# Patient Record
Sex: Female | Born: 1960
Health system: Southern US, Community
[De-identification: ages and names within clinical notes are randomized; demographics above are authoritative.]

## PROBLEM LIST (undated history)

## (undated) DIAGNOSIS — E785 Hyperlipidemia, unspecified: Secondary | ICD-10-CM

## (undated) HISTORY — PX: WISDOM TOOTH EXTRACTION: SHX21

## (undated) HISTORY — PX: COLONOSCOPY: SHX174

## (undated) HISTORY — PX: CYST EXCISION: SHX5701

## (undated) HISTORY — DX: Hyperlipidemia, unspecified: E78.5

---

## 2010-07-29 LAB — HM COLONOSCOPY

## 2015-02-21 LAB — BASIC METABOLIC PANEL
BUN: 15 (ref 4–21)
CREATININE: 0.8 (ref 0.5–1.1)
Glucose: 82
Potassium: 4.6 (ref 3.4–5.3)
Sodium: 141 (ref 137–147)

## 2015-02-21 LAB — CBC AND DIFFERENTIAL
HCT: 39 (ref 36–46)
Hemoglobin: 12.5 (ref 12.0–16.0)
PLATELETS: 270 (ref 150–399)
WBC: 5.2

## 2015-02-21 LAB — LIPID PANEL
CHOLESTEROL: 218 — AB (ref 0–200)
HDL: 72 — AB (ref 35–70)
LDL CALC: 135
Triglycerides: 56 (ref 40–160)

## 2015-02-21 LAB — HEPATIC FUNCTION PANEL
ALK PHOS: 86 (ref 25–125)
ALT: 18 (ref 7–35)
AST: 28 (ref 13–35)
Bilirubin, Total: 0.4

## 2015-02-21 LAB — TSH: TSH: 5.41 (ref 0.41–5.90)

## 2016-10-22 DIAGNOSIS — Z23 Encounter for immunization: Secondary | ICD-10-CM | POA: Diagnosis not present

## 2017-02-05 DIAGNOSIS — M9901 Segmental and somatic dysfunction of cervical region: Secondary | ICD-10-CM | POA: Diagnosis not present

## 2017-02-05 DIAGNOSIS — M62830 Muscle spasm of back: Secondary | ICD-10-CM | POA: Diagnosis not present

## 2017-02-05 DIAGNOSIS — M9902 Segmental and somatic dysfunction of thoracic region: Secondary | ICD-10-CM | POA: Diagnosis not present

## 2017-02-05 DIAGNOSIS — M9903 Segmental and somatic dysfunction of lumbar region: Secondary | ICD-10-CM | POA: Diagnosis not present

## 2017-02-19 DIAGNOSIS — M62830 Muscle spasm of back: Secondary | ICD-10-CM | POA: Diagnosis not present

## 2017-02-19 DIAGNOSIS — M9901 Segmental and somatic dysfunction of cervical region: Secondary | ICD-10-CM | POA: Diagnosis not present

## 2017-02-19 DIAGNOSIS — M9902 Segmental and somatic dysfunction of thoracic region: Secondary | ICD-10-CM | POA: Diagnosis not present

## 2017-02-19 DIAGNOSIS — M9903 Segmental and somatic dysfunction of lumbar region: Secondary | ICD-10-CM | POA: Diagnosis not present

## 2017-02-23 DIAGNOSIS — M9901 Segmental and somatic dysfunction of cervical region: Secondary | ICD-10-CM | POA: Diagnosis not present

## 2017-02-23 DIAGNOSIS — M62830 Muscle spasm of back: Secondary | ICD-10-CM | POA: Diagnosis not present

## 2017-02-23 DIAGNOSIS — M9902 Segmental and somatic dysfunction of thoracic region: Secondary | ICD-10-CM | POA: Diagnosis not present

## 2017-02-23 DIAGNOSIS — M9903 Segmental and somatic dysfunction of lumbar region: Secondary | ICD-10-CM | POA: Diagnosis not present

## 2017-02-25 DIAGNOSIS — M9902 Segmental and somatic dysfunction of thoracic region: Secondary | ICD-10-CM | POA: Diagnosis not present

## 2017-02-25 DIAGNOSIS — M62830 Muscle spasm of back: Secondary | ICD-10-CM | POA: Diagnosis not present

## 2017-02-25 DIAGNOSIS — M9901 Segmental and somatic dysfunction of cervical region: Secondary | ICD-10-CM | POA: Diagnosis not present

## 2017-02-25 DIAGNOSIS — M9903 Segmental and somatic dysfunction of lumbar region: Secondary | ICD-10-CM | POA: Diagnosis not present

## 2017-02-26 DIAGNOSIS — M9901 Segmental and somatic dysfunction of cervical region: Secondary | ICD-10-CM | POA: Diagnosis not present

## 2017-02-26 DIAGNOSIS — M9902 Segmental and somatic dysfunction of thoracic region: Secondary | ICD-10-CM | POA: Diagnosis not present

## 2017-02-26 DIAGNOSIS — M62830 Muscle spasm of back: Secondary | ICD-10-CM | POA: Diagnosis not present

## 2017-02-26 DIAGNOSIS — M9903 Segmental and somatic dysfunction of lumbar region: Secondary | ICD-10-CM | POA: Diagnosis not present

## 2017-03-03 DIAGNOSIS — M9902 Segmental and somatic dysfunction of thoracic region: Secondary | ICD-10-CM | POA: Diagnosis not present

## 2017-03-03 DIAGNOSIS — M9903 Segmental and somatic dysfunction of lumbar region: Secondary | ICD-10-CM | POA: Diagnosis not present

## 2017-03-03 DIAGNOSIS — M9901 Segmental and somatic dysfunction of cervical region: Secondary | ICD-10-CM | POA: Diagnosis not present

## 2017-03-03 DIAGNOSIS — M62830 Muscle spasm of back: Secondary | ICD-10-CM | POA: Diagnosis not present

## 2017-03-04 DIAGNOSIS — M9903 Segmental and somatic dysfunction of lumbar region: Secondary | ICD-10-CM | POA: Diagnosis not present

## 2017-03-04 DIAGNOSIS — M9902 Segmental and somatic dysfunction of thoracic region: Secondary | ICD-10-CM | POA: Diagnosis not present

## 2017-03-04 DIAGNOSIS — M62830 Muscle spasm of back: Secondary | ICD-10-CM | POA: Diagnosis not present

## 2017-03-04 DIAGNOSIS — M9901 Segmental and somatic dysfunction of cervical region: Secondary | ICD-10-CM | POA: Diagnosis not present

## 2017-03-06 DIAGNOSIS — M9902 Segmental and somatic dysfunction of thoracic region: Secondary | ICD-10-CM | POA: Diagnosis not present

## 2017-03-06 DIAGNOSIS — M9903 Segmental and somatic dysfunction of lumbar region: Secondary | ICD-10-CM | POA: Diagnosis not present

## 2017-03-06 DIAGNOSIS — M9901 Segmental and somatic dysfunction of cervical region: Secondary | ICD-10-CM | POA: Diagnosis not present

## 2017-03-06 DIAGNOSIS — M62830 Muscle spasm of back: Secondary | ICD-10-CM | POA: Diagnosis not present

## 2017-03-09 ENCOUNTER — Encounter (INDEPENDENT_AMBULATORY_CARE_PROVIDER_SITE_OTHER): Payer: Self-pay

## 2017-03-09 ENCOUNTER — Ambulatory Visit (INDEPENDENT_AMBULATORY_CARE_PROVIDER_SITE_OTHER): Payer: BLUE CROSS/BLUE SHIELD | Admitting: Orthopaedic Surgery

## 2017-03-09 ENCOUNTER — Encounter (INDEPENDENT_AMBULATORY_CARE_PROVIDER_SITE_OTHER): Payer: Self-pay | Admitting: Orthopaedic Surgery

## 2017-03-09 VITALS — BP 116/72 | HR 69 | Resp 14 | Ht 70.0 in | Wt 155.0 lb

## 2017-03-09 DIAGNOSIS — M7711 Lateral epicondylitis, right elbow: Secondary | ICD-10-CM

## 2017-03-09 MED ORDER — DICLOFENAC SODIUM 1 % TD GEL: 2.0000 g | Freq: Four times a day (QID) | TRANSDERMAL | 1 refills | Status: DC

## 2017-03-09 NOTE — Patient Instructions (Signed)
Tennis Elbow Rehab Ask your health care provider which exercises are safe for you. Do exercises exactly as told by your health care provider and adjust them as directed. It is normal to feel mild stretching, pulling, tightness, or discomfort as you do these exercises, but you should stop right away if you feel sudden pain or your pain gets worse. Do not begin these exercises until told by your health care provider. Stretching and range of motion exercises These exercises warm up your muscles and joints and improve the movement and flexibility of your elbow. These exercises also help to relieve pain, numbness, and tingling. Exercise A: Wrist extensor stretch Extend your left / right elbow with your fingers pointing down. Gently pull the palm of your left / right hand toward you until you feel a gentle stretch on the top of your forearm. To increase the stretch, push your left / right hand toward the outer edge or pinkie side of your forearm. Hold this position for __________ seconds. Repeat __________ times. Complete this exercise __________ times a day. If directed by your health care provider, repeat this stretch except do it with a bent elbow this time. Exercise B: Wrist flexor stretch  Extend your left / right elbow and turn your palm upward. Gently pull your left / right palm and fingertips back so your wrist extends and your fingers point more toward the ground. You should feel a gentle stretch on the inside of your forearm. Hold this position for __________ seconds. Repeat __________ times. Complete this exercise __________ times a day. If directed by your health care provider, repeat this stretch except do it with a bent elbow this time. Strengthening exercises These exercises build strength and endurance in your elbow. Endurance is the ability to use your muscles for a long time, even after they get tired. Exercise C: Wrist extensors  Sit with your left / right forearm palm-down and  fully supported on a table or countertop. Your elbow should be resting below the height of your shoulder. Let your left / right wrist extend over the edge of the surface. Loosely hold a __________ weight or a piece of rubber exercise band or tubing in your left / right hand. Slowly curl your left / right hand up toward your forearm. If you are using band or tubing, hold the band or tubing in place with your other hand to provide resistance. Hold this position for __________ seconds. Slowly return to the starting position. Repeat __________ times. Complete this exercise __________ times a day. Exercise D: Radial deviators  Stand with a __________ weight in your left / righthand. Or, sit while holding a rubber exercise band or tubing with your other arm supported on a table or countertop. Position your hand so your thumb is on top. Raise your hand upward in front of you so your thumb travels toward your forearm, or pull up on the rubber tubing. Hold this position for __________ seconds. Slowly return to the starting position. Repeat __________ times. Complete this exercise __________ times a day. Exercise E: Eccentric wrist extensors Sit with your left / right forearm palm-down and fully supported on a table or countertop. Your elbow should be resting below the height of your shoulder. If told by your health care provider, hold a __________ weight in your hand. Let your left / right wrist extend over the edge of the surface. Use your other hand to lift up your left / right hand toward your forearm. Keep your forearm  on the table. Using only the muscles in your left / right hand, slowly lower your hand back down to the starting position. Repeat __________ times. Complete this exercise __________ times a day. This information is not intended to replace advice given to you by your health care provider. Make sure you discuss any questions you have with your health care provider. Document Released:  02/03/2005 Document Revised: 10/10/2015 Document Reviewed: 11/02/2014 Elsevier Interactive Patient Education  2018 [employer] Elbow Tennis elbow is puffiness (inflammation) of the outer tendons of your forearm close to your elbow. Your tendons attach your muscles to your bones. Tennis elbow can happen in any sport or job in which you use your elbow too much. It is caused by doing the same motion over and over. Tennis elbow can cause:  Pain and tenderness in your forearm and the outer part of your elbow.  A burning feeling. This runs from your elbow through your arm.  Weak grip in your hands.  Follow these instructions at home: Activity  Rest your elbow and wrist as told by your doctor. Try to avoid any activities that caused the problem until your doctor says that you can do them again.  If a physical therapist teaches you exercises, do all of them as told.  If you lift an object, lift it with your palm facing up. This is easier on your elbow. Lifestyle  If your tennis elbow is caused by sports, check your equipment and make sure that: ? You are using it correctly. ? It fits you well.  If your tennis elbow is caused by work, take breaks often, if you are able. Talk with your manager about doing your work in a way that is safe for you. ? If your tennis elbow is caused by computer use, talk with your manager about any changes that can be made to your work setup. General instructions  If told, apply ice to the painful area: ? Put ice in a plastic bag. ? Place a towel between your skin and the bag. ? Leave the ice on for 20 minutes, 2-3 times per day.  Take medicines only as told by your doctor.  If you were given a brace, wear it as told by your doctor.  Keep all follow-up visits as told by your doctor. This is important. Contact a doctor if:  Your pain does not get better with treatment.  Your pain gets worse.  You have weakness in your forearm, hand, or  fingers.  You cannot feel your forearm, hand, or fingers. This information is not intended to replace advice given to you by your health care provider. Make sure you discuss any questions you have with your health care provider. Document Released: 07/24/2009 Document Revised: 10/04/2015 Document Reviewed: 01/30/2014 Elsevier Interactive Patient Education  [employer].

## 2017-03-09 NOTE — Progress Notes (Signed)
Office Visit Note   Patient: Christine Medina           Date of Birth: 03-Jul-1960           MRN: [id number] Visit Date: 03/09/2017              Requested by: No referring provider defined for this encounter. PCP: Patient, No Pcp Per   Assessment & Plan: Visit Diagnoses:  1. Lateral epicondylitis of right elbow     Plan: Right elbow pain is consistent with tennis elbow. We'll plan tennis elbow strap, physical therapy and Voltaren gel. Have discussed other options including cortisone injection in future. Also has many other areas of tenderness including right foot with pain consistent with peroneal tendinitis at its insertion on the base the fifth metatarsal. Has arthritis at the base of both thumbs appears to be mild.. Also experiencing some discomfort in both the right arm and right leg which has been helped with Pilates and chiropractic treatment. I would suggest she continue with that as she's not localizing symptoms  Follow-Up Instructions: Return if symptoms worsen or fail to improve.   Orders:  No orders of the defined types were placed in this encounter.  No orders of the defined types were placed in this encounter.     Procedures: No procedures performed   Clinical Data: No additional findings.   Subjective: Chief Complaint  Patient presents with  . Right Elbow - Pain    Mrs. Tunks is a 57 y o here today for Right elbow pain including all the upper extremity to neck. Pt moved here 4 mo ago, played tennis, golf and paddle tennis with son and is annoying now.   Mrs. Guilliams relates onset of right lateral elbow pain after playing tennis with her son on several occasions this past summer. The pain seems to recur when she's been active whether it's playing tennis or golf or the pain is localized along the lateral aspect of her elbow without numbness tingling or change in appearance of the elbow.. Also is been arrested experiencing some pain in the base of both thumbs  and the lateral aspect of her right foot. Also describes some tingling and numbness in right upper and right lower extremity on occasion that more recently has responded to chiropractic treatment and Pilates exercises  HPI  Review of Systems   Objective: Vital Signs: BP 116/72   Pulse 69   Resp 14   Ht 5\' 10"  (1.778 m)   Wt 155 lb (70.3 kg)   BMI 22.24 kg/m   Physical Exam  Ortho Exam awake alert and oriented 3. Comfortable sitting. Painless range of motion cervical spine without referred pain to either upper extremity. No localized tenderness. No pain with shoulder range of motion.  Right elbow tenderness directly over the lateral lip condyle. Has pain with grip and flexion mildly and more pain with grip and extension. No pain over the radial head. No pain along the olecranon. Neurovascular exam is intact. Some mild pain at the base of both thumbs with minimally positive grind test. Straight leg raise negative bilaterally. Painless range of motion both hips and both knees. Vascular exam intact. Reflexes intact. Motor exam intact. No percussible tenderness to cervical or lumbar spine. Discomfort at the very base of the right fifth metatarsal peroneal tendon function intact. No skin change  Specialty Comments:  No specialty comments available.  Imaging: No results found.   PMFS History: Patient Active Problem List   Diagnosis Date  Noted  . Lateral epicondylitis of right elbow 03/09/2017   History reviewed. No pertinent past medical history.  Family History  Problem Relation Age of Onset  . Cancer Mother   . Heart disease Father     History reviewed. No pertinent surgical history. Social History   Occupational History  . Not on file  Tobacco Use  . Smoking status: Never Smoker  . Smokeless tobacco: Never Used  Substance and Sexual Activity  . Alcohol use: Yes    Alcohol/week: 1.8 oz    Types: 3 Standard drinks or equivalent per week  . Drug use: No  . Sexual  activity: Not on file     Garald Balding, MD   Note - This record has been created using [employer].  Chart creation errors have been sought, but may not always  have been located. Such creation errors do not reflect on  the standard of medical care.

## 2017-03-10 ENCOUNTER — Telehealth (INDEPENDENT_AMBULATORY_CARE_PROVIDER_SITE_OTHER): Payer: Self-pay | Admitting: Orthopaedic Surgery

## 2017-03-10 DIAGNOSIS — M9903 Segmental and somatic dysfunction of lumbar region: Secondary | ICD-10-CM | POA: Diagnosis not present

## 2017-03-10 DIAGNOSIS — M9901 Segmental and somatic dysfunction of cervical region: Secondary | ICD-10-CM | POA: Diagnosis not present

## 2017-03-10 DIAGNOSIS — M9902 Segmental and somatic dysfunction of thoracic region: Secondary | ICD-10-CM | POA: Diagnosis not present

## 2017-03-10 DIAGNOSIS — M62830 Muscle spasm of back: Secondary | ICD-10-CM | POA: Diagnosis not present

## 2017-03-10 NOTE — Telephone Encounter (Signed)
Patient called stating that [facility] was unable to fill her prescription for the Voltaren Gel.  She was told that additional information was needed before the prescription could be filled.  Patient's CB# [phone]

## 2017-03-10 NOTE — Telephone Encounter (Signed)
Sent add'l info

## 2017-03-11 DIAGNOSIS — M62830 Muscle spasm of back: Secondary | ICD-10-CM | POA: Diagnosis not present

## 2017-03-11 DIAGNOSIS — M9902 Segmental and somatic dysfunction of thoracic region: Secondary | ICD-10-CM | POA: Diagnosis not present

## 2017-03-11 DIAGNOSIS — M9903 Segmental and somatic dysfunction of lumbar region: Secondary | ICD-10-CM | POA: Diagnosis not present

## 2017-03-11 DIAGNOSIS — M9901 Segmental and somatic dysfunction of cervical region: Secondary | ICD-10-CM | POA: Diagnosis not present

## 2017-03-13 ENCOUNTER — Telehealth (INDEPENDENT_AMBULATORY_CARE_PROVIDER_SITE_OTHER): Payer: Self-pay | Admitting: Orthopaedic Surgery

## 2017-03-13 DIAGNOSIS — M9901 Segmental and somatic dysfunction of cervical region: Secondary | ICD-10-CM | POA: Diagnosis not present

## 2017-03-13 DIAGNOSIS — M62830 Muscle spasm of back: Secondary | ICD-10-CM | POA: Diagnosis not present

## 2017-03-13 DIAGNOSIS — M9903 Segmental and somatic dysfunction of lumbar region: Secondary | ICD-10-CM | POA: Diagnosis not present

## 2017-03-13 DIAGNOSIS — M9902 Segmental and somatic dysfunction of thoracic region: Secondary | ICD-10-CM | POA: Diagnosis not present

## 2017-03-13 NOTE — Telephone Encounter (Signed)
Patient called stating that she still has not heard from the physical therapist to schedule an appointment.  She was unclear whether our office was making the referral appointment or if she was suppose to call them.  Patient's CB# [phone]

## 2017-03-16 ENCOUNTER — Other Ambulatory Visit (INDEPENDENT_AMBULATORY_CARE_PROVIDER_SITE_OTHER): Payer: Self-pay

## 2017-03-16 DIAGNOSIS — M25521 Pain in right elbow: Secondary | ICD-10-CM

## 2017-03-16 DIAGNOSIS — M9901 Segmental and somatic dysfunction of cervical region: Secondary | ICD-10-CM | POA: Diagnosis not present

## 2017-03-16 DIAGNOSIS — M62830 Muscle spasm of back: Secondary | ICD-10-CM | POA: Diagnosis not present

## 2017-03-16 DIAGNOSIS — M9902 Segmental and somatic dysfunction of thoracic region: Secondary | ICD-10-CM | POA: Diagnosis not present

## 2017-03-16 DIAGNOSIS — M9903 Segmental and somatic dysfunction of lumbar region: Secondary | ICD-10-CM | POA: Diagnosis not present

## 2017-03-16 NOTE — Telephone Encounter (Signed)
Called and sent referral, will check later to make sure they have order and schedule pt.

## 2017-03-17 DIAGNOSIS — M9903 Segmental and somatic dysfunction of lumbar region: Secondary | ICD-10-CM | POA: Diagnosis not present

## 2017-03-17 DIAGNOSIS — M9901 Segmental and somatic dysfunction of cervical region: Secondary | ICD-10-CM | POA: Diagnosis not present

## 2017-03-17 DIAGNOSIS — M62830 Muscle spasm of back: Secondary | ICD-10-CM | POA: Diagnosis not present

## 2017-03-17 DIAGNOSIS — M9902 Segmental and somatic dysfunction of thoracic region: Secondary | ICD-10-CM | POA: Diagnosis not present

## 2017-03-25 ENCOUNTER — Encounter: Payer: Self-pay | Admitting: Physical Therapy

## 2017-03-25 ENCOUNTER — Ambulatory Visit: Payer: BLUE CROSS/BLUE SHIELD | Attending: Orthopaedic Surgery | Admitting: Physical Therapy

## 2017-03-25 ENCOUNTER — Other Ambulatory Visit: Payer: Self-pay

## 2017-03-25 DIAGNOSIS — M25521 Pain in right elbow: Secondary | ICD-10-CM

## 2017-03-26 NOTE — Therapy (Addendum)
[place], [place], [address] Phone: [phone]   Fax:  [phone]  Physical Therapy Evaluation  Patient Details  Name: Porter Nakama MRN: [id number] Date of Birth: August 15, 1960 Referring Provider: Dr Joni Fears    Encounter Date: 03/25/2017  PT End of Session - 03/26/17 1209    Visit Number  1    Number of Visits  16    Date for PT Re-Evaluation  05/21/17    Authorization Type  [employer]     PT Start Time  1500    PT Stop Time  1544    PT Time Calculation (min)  44 min    Activity Tolerance  Patient tolerated treatment well    Behavior During Therapy  [facility] for tasks assessed/performed       History reviewed. No pertinent past medical history.  History reviewed. No pertinent surgical history.  There were no vitals filed for this visit.   Subjective Assessment - 03/25/17 1508    Subjective  Patient reports Pain in her elbow that radiates at times into her hand. The pain at times starts in her neck and shoots down her arm. She is currently being treated for her neck as well. The pain started playing tennis a year ago. It is worse now when she plays golf. She is having no pain today.     Diagnostic tests  Nothing taken     Patient Stated Goals  to have less pain in her elbow     Currently in Pain?  Yes    Pain Score  7     Pain Location  Elbow    Pain Orientation  Right    Pain Descriptors / Indicators  Aching    Pain Type  Acute pain    Pain Onset  More than a month ago    Pain Frequency  Constant         [facility] PT Assessment - 03/26/17 0001      Assessment   Medical Diagnosis  Right elbow pain     Referring Provider  Dr Joni Fears     Onset Date/Surgical Date  -- August 2018     Hand Dominance  Right    Next MD Visit  None Scheduled     Prior Therapy  Going to Chiropracter for her neck       Precautions   Precautions  None      Restrictions   Weight Bearing Restrictions  No      Balance Screen   Has the patient fallen in the past 6 months  No    Has the patient had a decrease in activity level because of a fear of falling?   No    Is the patient reluctant to leave their home because of a fear of falling?   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     Leisure  Golf      Cognition   Overall Cognitive Status  Within Functional Limits for tasks assessed    Attention  Focused    Focused Attention  Appears intact    Memory  Appears intact    Awareness  Appears intact    Problem Solving  Appears intact      Sensation  Light Touch  Appears Intact    Additional Comments  tingling into the leg through the foot       AROM   Overall AROM Comments  full active ROM of wrist and elbow      PROM   Overall PROM Comments  Full passive ROM of the wrist       Strength   Overall Strength Comments  5/5 gross shoulder strength     Right Hand Grip (lbs)  65    Left Hand Grip (lbs)  60      Palpation   Palpation comment  tenderness to palpation in the lateral epicondyle; spasming of the right upper trap              Objective measurements completed on examination: See above findings.              PT Education - 03/26/17 1208    Education provided  Yes    Education Details  HEP; improtance of improving strength and mobility     Person(s) Educated  Patient    Methods  Explanation;Demonstration;Tactile cues;Verbal cues    Comprehension  Verbalized understanding;Returned demonstration;Verbal cues required;Tactile cues required          PT Long Term Goals - 03/25/17 1600      PT LONG TERM GOAL #1   Title  Patient will retunr to golf without pain     Time  6    Period  Weeks    Status  New    Target Date  05/06/17      PT LONG TERM GOAL #2   Title  Patient will carry object without  pain     Time  6    Period  Weeks    Status  New    Target Date  05/06/17      PT LONG TERM GOAL #3   Title  Patient will demsotrte full wrist and elbow motion without pain in order to perform daily tasks     Time  6    Period  Weeks    Status  New    Target Date  05/06/17             Plan - 03/26/17 1210    Clinical Impression Statement  Patient is a 57 year old female with elbow pain. Her pain raidates down into her arm. She also has pain that radiates down her neck to her hand. Today she had minial deficits. She is also haveing no pain today. She does have tenderness to palpation in the lateral epicondyle. She also has tightness in her right upper trap. She would benefit from further skilled therapy. She would like to try the exercises and sttretches on her own. she may return for a follow up as needed. she was also given stretches for her upper trap spasming.     History and Personal Factors relevant to plan of care:  none     Clinical Presentation  Stable    Clinical Decision Making  Low    PT Frequency  1x / week    PT Duration  4 weeks    PT Treatment/Interventions  ADLs/Self Care Home Management;Cryotherapy;Electrical Stimulation;Iontophoresis 67m/ml Dexamethasone;Ultrasound;Therapeutic activities;Therapeutic exercise;Patient/family education;Manual techniques;Passive range of motion;Taping    PT Next Visit Plan  if patient returns manual therapy and consider modaliites.     PT Home Exercise Plan  upper trap stretch, supination stretch, Levator stretch; transverse friction massage; ice massage     Consulted  and Agree with Plan of Care  Patient       Patient will benefit from skilled therapeutic intervention in order to improve the following deficits and impairments:  Pain, Impaired UE functional use, Decreased activity tolerance  Visit Diagnosis: Pain in right elbow   PHYSICAL THERAPY DISCHARGE SUMMARY  Visits from Start of Care: 1  Current functional level related  to goals / functional outcomes: 1x visit    Remaining deficits: Unknown    Education / Equipment: HEP   Plan: Patient agrees to discharge.  Patient goals were not met. Patient is being discharged due to the patient's request.  ?????      Problem List Patient Active Problem List   Diagnosis Date Noted  . Lateral epicondylitis of right elbow 03/09/2017    Carney Living PT DPT  03/26/2017, 12:20 PM  [facility] [address] [place], [place], [address] Phone: [phone]   Fax:  [phone]  Name: Symphoni Helbling MRN: [id number] Date of Birth: Mar 19, 1960

## 2017-04-22 ENCOUNTER — Other Ambulatory Visit (HOSPITAL_COMMUNITY)
Admission: RE | Admit: 2017-04-22 | Discharge: 2017-04-22 | Disposition: A | Discharge status: Home / Self Care | Payer: BLUE CROSS/BLUE SHIELD | Source: Ambulatory Visit | Attending: Obstetrics and Gynecology | Admitting: Obstetrics and Gynecology

## 2017-04-22 ENCOUNTER — Other Ambulatory Visit: Payer: Self-pay

## 2017-04-22 ENCOUNTER — Ambulatory Visit: Payer: BLUE CROSS/BLUE SHIELD | Admitting: Obstetrics and Gynecology

## 2017-04-22 ENCOUNTER — Encounter: Payer: Self-pay | Admitting: Obstetrics and Gynecology

## 2017-04-22 VITALS — BP 136/80 | HR 72 | Resp 14 | Ht 69.75 in | Wt 161.0 lb

## 2017-04-22 DIAGNOSIS — Z0000 Encounter for general adult medical examination without abnormal findings: Secondary | ICD-10-CM | POA: Diagnosis not present

## 2017-04-22 DIAGNOSIS — N393 Stress incontinence (female) (male): Secondary | ICD-10-CM

## 2017-04-22 DIAGNOSIS — N941 Unspecified dyspareunia: Secondary | ICD-10-CM

## 2017-04-22 DIAGNOSIS — Z124 Encounter for screening for malignant neoplasm of cervix: Secondary | ICD-10-CM | POA: Insufficient documentation

## 2017-04-22 DIAGNOSIS — Z01419 Encounter for gynecological examination (general) (routine) without abnormal findings: Secondary | ICD-10-CM | POA: Diagnosis not present

## 2017-04-22 NOTE — Patient Instructions (Addendum)
EXERCISE AND DIET:  We recommended that you start or continue a regular exercise program for good health. Regular exercise means any activity that makes your heart beat faster and makes you sweat.  We recommend exercising at least 30 minutes per day at least 3 days a week, preferably 4 or 5.  We also recommend a diet low in fat and sugar.  Inactivity, poor dietary choices and obesity can cause diabetes, heart attack, stroke, and kidney damage, among others.    ALCOHOL AND SMOKING:  Women should limit their alcohol intake to no more than 7 drinks/beers/glasses of wine (combined, not each!) per week. Moderation of alcohol intake to this level decreases your risk of breast cancer and liver damage. And of course, no recreational drugs are part of a healthy lifestyle.  And absolutely no smoking or even second hand smoke. Most people know smoking can cause heart and lung diseases, but did you know it also contributes to weakening of your bones? Aging of your skin?  Yellowing of your teeth and nails?  CALCIUM AND VITAMIN D:  Adequate intake of calcium and Vitamin D are recommended.  The recommendations for exact amounts of these supplements seem to change often, but generally speaking 600 mg of calcium (either carbonate or citrate) and 800 units of Vitamin D per day seems prudent. Certain women may benefit from higher intake of Vitamin D.  If you are among these women, your doctor will have told you during your visit.    PAP SMEARS:  Pap smears, to check for cervical cancer or precancers,  have traditionally been done yearly, although recent scientific advances have shown that most women can have pap smears less often.  However, every woman still should have a physical exam from her gynecologist every year. It will include a breast check, inspection of the vulva and vagina to check for abnormal growths or skin changes, a visual exam of the cervix, and then an exam to evaluate the size and shape of the uterus and  ovaries.  And after 58 years of age, a rectal exam is indicated to check for rectal cancers. We will also provide age appropriate advice regarding health maintenance, like when you should have certain vaccines, screening for sexually transmitted diseases, bone density testing, colonoscopy, mammograms, etc.   MAMMOGRAMS:  All women over 40 years old should have a yearly mammogram. Many facilities now offer a "3D" mammogram, which may cost around $50 extra out of pocket. If possible,  we recommend you accept the option to have the 3D mammogram performed.  It both reduces the number of women who will be called back for extra views which then turn out to be normal, and it is better than the routine mammogram at detecting truly abnormal areas.    COLONOSCOPY:  Colonoscopy to screen for colon cancer is recommended for all women at age 50.  We know, you hate the idea of the prep.  We agree, BUT, having colon cancer and not knowing it is worse!!  Colon cancer so often starts as a polyp that can be seen and removed at colonscopy, which can quite literally save your life!  And if your first colonoscopy is normal and you have no family history of colon cancer, most women don't have to have it again for 10 years.  Once every ten years, you can do something that may end up saving your life, right?  We will be happy to help you get it scheduled when you are ready.    Be sure to check your insurance coverage so you understand how much it will cost.  It may be covered as a preventative service at no cost, but you should check your particular policy.      Kegel Exercises Kegel exercises help strengthen the muscles that support the rectum, vagina, small intestine, bladder, and uterus. Doing Kegel exercises can help:  Improve bladder and bowel control.  Improve sexual response.  Reduce problems and discomfort during pregnancy.  Kegel exercises involve squeezing your pelvic floor muscles, which are the same muscles you  squeeze when you try to stop the flow of urine. The exercises can be done while sitting, standing, or lying down, but it is best to vary your position. Phase 1 exercises 1. Squeeze your pelvic floor muscles tight. You should feel a tight lift in your rectal area. If you are a female, you should also feel a tightness in your vaginal area. Keep your stomach, buttocks, and legs relaxed. 2. Hold the muscles tight for up to 10 seconds. 3. Relax your muscles. Repeat this exercise 50 times a day or as many times as told by your health care provider. Continue to do this exercise for at least 4-6 weeks or for as long as told by your health care provider. This information is not intended to replace advice given to you by your health care provider. Make sure you discuss any questions you have with your health care provider. Document Released: 01/21/2012 Document Revised: 09/29/2015 Document Reviewed: 12/24/2014 Elsevier Interactive Patient Education  2018 [employer]  

## 2017-04-22 NOTE — Progress Notes (Addendum)
57 y.o. [id number] MarriedCaucasianF here for annual exam.   She c/o leaking urine with cough or sneeze. Most days she leaks some, small amount. No urge incontinence.  Symptoms started in the last 6 months. No urinary frequency, urgency or dysuria.  No vaginal bleeding. Sexually active, some entry dyspareunia. No vasomotor symptoms.     No LMP recorded. Patient is postmenopausal.          Sexually active: Yes.    The current method of family planning is post menopausal status.    Exercising: Yes.    pilates/ walking  Smoker:  Former smoker  Health Maintenance: Pap:  2018 WNL per patient  History of abnormal Pap:  no MMG:  Summer 2018  WNL per patient  Colonoscopy: up to date with PCP WNL per ptaient  BMD:  PCP - Normal  TDaP:  Unsure, thinks UTD Gardasil: N/A   reports that she has quit smoking. Her smoking use included cigarettes. She has a 2.50 pack-year smoking history. she has never used smokeless tobacco. She reports that she drinks about 3.0 - 3.6 oz of alcohol per week. She reports that she does not use drugs. Just moved here from [place]. Not working. 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 Daughter is 66, doing a Oceanographer in [place] [employer] of Conservation officer, nature).     History reviewed. No pertinent past medical history.  History reviewed. No pertinent surgical history.  Current Outpatient Medications  Medication Sig Dispense Refill  . diclofenac sodium (VOLTAREN) 1 % GEL Apply 2 g topically 4 (four) times daily. 3 Tube 1   No current facility-administered medications for this visit.     Family History  Problem Relation Age of Onset  . Cancer Mother   . Brain cancer Mother   . Heart disease Father   . Lung cancer Sister   . Cancer Sister   . Multiple myeloma Sister     Review of Systems  Constitutional: Negative.   HENT: Negative.   Eyes: Negative.   Respiratory: Negative.   Cardiovascular: Negative.   Gastrointestinal: Negative.   Endocrine: Negative.    Genitourinary: Negative.   Musculoskeletal: Negative.   Skin: Negative.   Allergic/Immunologic: Negative.   Neurological: Negative.   Psychiatric/Behavioral: Negative.     Exam:   BP 136/80 (BP Location: Right Arm, Patient Position: Sitting, Cuff Size: Normal)   Pulse 72   Resp 14   Ht 5' 9.75" (1.772 m)   Wt 161 lb (73 kg)   BMI 23.27 kg/m   Weight change: '@WEIGHTCHANGE'$ @ Height:   Height: 5' 9.75" (177.2 cm)  Ht Readings from Last 3 Encounters:  04/22/17 5' 9.75" (1.772 m)  03/09/17 '5\' 10"'$  (1.778 m)    General appearance: alert, cooperative and appears stated age Head: Normocephalic, without obvious abnormality, atraumatic Neck: no adenopathy, supple, symmetrical, trachea midline and thyroid normal to inspection and palpation Lungs: clear to auscultation bilaterally Cardiovascular: regular rate and rhythm Breasts: normal appearance, no masses or tenderness Abdomen: soft, non-tender; non distended,  no masses,  no organomegaly Extremities: extremities normal, atraumatic, no cyanosis or edema Skin: Skin color, texture, turgor normal. No rashes or lesions Lymph nodes: Cervical, supraclavicular, and axillary nodes normal. No abnormal inguinal nodes palpated Neurologic: Grossly normal   Pelvic: External genitalia:  no lesions              Urethra:  normal appearing urethra with no masses, tenderness or lesions  Bartholins and Skenes: normal                 Vagina: normal appearing atrophic vagina with normal color and discharge, no lesions              Cervix: no lesions               Bimanual Exam:  Uterus:  normal size, contour, position, consistency, mobility, non-tender              Adnexa: no mass, fullness, tenderness               Rectovaginal: Confirms               Anus:  normal sphincter tone, no lesions  Chaperone was present for exam.  A:  Well Woman with normal exam  GSI  Entry dyspareunia.   She reports a h/o dense breasts, thinks she is  supposed to have a diagnostic mammogram. Will wait for her records  P:    Pap with hpv  Mammogram this summer  Discussed breast self exam  Discussed calcium and vit D intake  Screening labs  Try vaginal lubricant, she will call if she wants to try vaginal estrogen  Addendum:  I reviewed her mammogram report from 5/18, the recommendation if for a screening mammogram. Will recommend 3D

## 2017-04-23 LAB — CBC
HEMOGLOBIN: 12.8 g/dL (ref 11.1–15.9)
Hematocrit: 39 % (ref 34.0–46.6)
MCH: 28.6 pg (ref 26.6–33.0)
MCHC: 32.8 g/dL (ref 31.5–35.7)
MCV: 87 fL (ref 79–97)
PLATELETS: 295 10*3/uL (ref 150–379)
RBC: 4.48 x10E6/uL (ref 3.77–5.28)
RDW: 13.3 % (ref 12.3–15.4)
WBC: 5.5 10*3/uL (ref 3.4–10.8)

## 2017-04-23 LAB — COMPREHENSIVE METABOLIC PANEL
ALBUMIN: 4.7 g/dL (ref 3.5–5.5)
ALT: 23 IU/L (ref 0–32)
AST: 27 IU/L (ref 0–40)
Albumin/Globulin Ratio: 1.8 (ref 1.2–2.2)
Alkaline Phosphatase: 100 IU/L (ref 39–117)
BUN / CREAT RATIO: 25 — AB (ref 9–23)
BUN: 18 mg/dL (ref 6–24)
Bilirubin Total: 0.2 mg/dL (ref 0.0–1.2)
CALCIUM: 9.5 mg/dL (ref 8.7–10.2)
CO2: 25 mmol/L (ref 20–29)
CREATININE: 0.73 mg/dL (ref 0.57–1.00)
Chloride: 101 mmol/L (ref 96–106)
GFR, EST AFRICAN AMERICAN: 106 mL/min/{1.73_m2} (ref >59)
GFR, EST NON AFRICAN AMERICAN: 92 mL/min/{1.73_m2} (ref >59)
GLUCOSE: 85 mg/dL (ref 65–99)
Globulin, Total: 2.6 g/dL (ref 1.5–4.5)
Potassium: 4.4 mmol/L (ref 3.5–5.2)
Sodium: 139 mmol/L (ref 134–144)
TOTAL PROTEIN: 7.3 g/dL (ref 6.0–8.5)

## 2017-04-23 LAB — URINE CULTURE

## 2017-04-23 LAB — URINALYSIS, MICROSCOPIC ONLY
Bacteria, UA: NONE SEEN
Casts: NONE SEEN /lpf

## 2017-04-23 LAB — LIPID PANEL
CHOL/HDL RATIO: 3 ratio (ref 0.0–4.4)
Cholesterol, Total: 249 mg/dL — ABNORMAL HIGH (ref 100–199)
HDL: 84 mg/dL (ref >39)
LDL Calculated: 153 mg/dL — ABNORMAL HIGH (ref 0–99)
Triglycerides: 61 mg/dL (ref 0–149)
VLDL CHOLESTEROL CAL: 12 mg/dL (ref 5–40)

## 2017-04-24 LAB — CYTOLOGY - PAP
DIAGNOSIS: NEGATIVE
HPV: NOT DETECTED

## 2017-05-01 ENCOUNTER — Encounter: Payer: Self-pay | Admitting: Physician Assistant

## 2017-05-01 ENCOUNTER — Ambulatory Visit: Payer: BLUE CROSS/BLUE SHIELD | Admitting: Physician Assistant

## 2017-05-01 ENCOUNTER — Other Ambulatory Visit: Payer: Self-pay

## 2017-05-01 VITALS — BP 98/60 | HR 85 | Temp 97.7°F | Resp 18 | Ht 70.08 in | Wt 160.6 lb

## 2017-05-01 DIAGNOSIS — R05 Cough: Secondary | ICD-10-CM

## 2017-05-01 DIAGNOSIS — R059 Cough, unspecified: Secondary | ICD-10-CM

## 2017-05-01 MED ORDER — HYDROCODONE-HOMATROPINE 5-1.5 MG/5ML PO SYRP: 5.0000 mL | ORAL_SOLUTION | Freq: Three times a day (TID) | ORAL | 0 refills | Status: DC | PRN

## 2017-05-01 NOTE — Progress Notes (Signed)
Christine Medina  MRN: [id number] DOB: [birth year]  PCP: Patient, No Pcp Per  Chief Complaint  Patient presents with  . Cough    has been keeping her up at night, has tried different meds and nothing helps     Subjective:  Pt presents to clinic for cough x three nights. Patient states that she is up hourly at night coughing. Patient describes it as a dry and irritating, non-productive cough. Patient is drinking plenty of water and using throat lozenges to alleviate the cough. She finds immediate relief for about an hour, then it's back. Patient reports fatigue as this cough disrupts her sleep. The cough is worst at night. Patient denies recent URI, chest pain, body aches, fevers, chills, nausea, vomitting or appetite change. Patient had a sore throat prior to the cough. Patient is going overseas ([place], [place] next week and is concerned she may get sicker abroad. Patient states that she has never had GERD, or symptoms of acid reflux or indigestion. Patient has had pneumonia before, and states that this cough does not feel like that. Patient has been traveling on airplanes recently, but has no sick contacts.   History is obtained by patient.   Review of Systems  Constitutional: Negative.  Negative for activity change, appetite change, chills, fatigue, fever and unexpected weight change.  HENT: Positive for sore throat. Negative for congestion, ear pain, rhinorrhea, sinus pressure and sinus pain. Postnasal drip: Possible post nasal drip.   Eyes: Negative.  Negative for discharge and itching.  Respiratory: Positive for cough. Negative for chest tightness, shortness of breath and wheezing.   Cardiovascular: Negative.  Negative for chest pain and palpitations.  Gastrointestinal: Negative.  Negative for abdominal pain, anal bleeding, nausea and vomiting.  Musculoskeletal: Negative.  Negative for arthralgias, joint swelling and myalgias.  Skin: Negative for color change and rash.    Allergic/Immunologic: Negative.  Negative for environmental allergies and food allergies.  Neurological: Negative.  Negative for dizziness, light-headedness, numbness and headaches.  Psychiatric/Behavioral: Positive for sleep disturbance. Negative for dysphoric mood. The patient is not nervous/anxious.     Patient Active Problem List   Diagnosis Date Noted  . Lateral epicondylitis of right elbow 03/09/2017    Current Outpatient Medications on File Prior to Visit  Medication Sig Dispense Refill  . diclofenac sodium (VOLTAREN) 1 % GEL Apply topically 4 (four) times daily.     No current facility-administered medications on file prior to visit.     No Known Allergies  History reviewed. No pertinent past medical history. Social History   Social History Narrative   Just moved here from [place] with husband.    Social History   Tobacco Use  . Smoking status: Former Smoker    Packs/day: 0.50    Years: 5.00    Pack years: 2.50    Types: Cigarettes  . Smokeless tobacco: Never Used  Substance Use Topics  . Alcohol use: Yes    Alcohol/week: 6.0 oz    Types: 10 Glasses of wine per week  . Drug use: No   family history includes Brain cancer in her mother; Cancer in her mother and sister; Heart disease in her father; Hyperlipidemia in her mother; Lung cancer in her sister; Multiple myeloma in her sister.  Past Surgical History:  Procedure Laterality Date  . CYST EXCISION    . WISDOM TOOTH EXTRACTION       Objective:  BP 98/60   Pulse 85   Temp 97.7 F (36.5 C) (Oral)  Resp 18   Ht 5' 10.08" (1.78 m)   Wt 160 lb 9.6 oz (72.8 kg)   SpO2 97%   BMI 22.99 kg/m  Body mass index is 22.99 kg/m.  Physical Exam  Constitutional: She is oriented to person, place, and time and well-developed, well-nourished, and in no distress.  BP 98/60   Pulse 85   Temp 97.7 F (36.5 C) (Oral)   Resp 18   Ht 5' 10.08" (1.78 m)   Wt 160 lb 9.6 oz (72.8 kg)   SpO2 97%   BMI 22.99  kg/m   HENT:  Head: Normocephalic and atraumatic.  Right Ear: External ear normal.  Left Ear: External ear normal.  Nose: Nose normal.  Oropharynx slightly erythematous.   Eyes: Conjunctivae and EOM are normal. Pupils are equal, round, and reactive to light.  Neck: Normal range of motion. Neck supple.  Cardiovascular: Normal rate, regular rhythm, normal heart sounds and intact distal pulses. Exam reveals no gallop and no friction rub.  No murmur heard. Pulmonary/Chest: Effort normal and breath sounds normal. She has no wheezes. She has no rales.  Musculoskeletal: Normal range of motion.  Neurological: She is alert and oriented to person, place, and time. She has normal reflexes. Gait normal. GCS score is 15.  Skin: Skin is warm and dry. No rash noted. No erythema. No pallor.  Psychiatric: Mood, memory, affect and judgment normal.    Assessment and Plan :  Cough  pt likely has either a viral illness or allergies trigger this cough- we will treat the cough and other symptomatic treatments - she will contact me in 5 days if she is not improved for an abx to take with her overseas.  Follow up if symptoms worsen or fail to improve.   Windell Hummingbird PA-C  [facility] 05/01/2017 4:17 PM

## 2017-05-01 NOTE — Patient Instructions (Signed)
     IF you received an x-ray today, you will receive an invoice from [facility]. Please contact [facility] at [phone] with questions or concerns regarding your invoice.   IF you received labwork today, you will receive an invoice from [facility]. Please contact [facility] at [phone] with questions or concerns regarding your invoice.   Our billing staff will not be able to assist you with questions regarding bills from these companies.  You will be contacted with the lab results as soon as they are available. The fastest way to get your results is to activate your My Chart account. Instructions are located on the last page of this paperwork. If you have not heard from us regarding the results in 2 weeks, please contact this office.     

## 2017-05-01 NOTE — Progress Notes (Deleted)
   Subjective:    Patient ID: Christine Medina, female    DOB: 10-03-1960, 57 y.o.   MRN: [id number]  HPI    Review of Systems     Objective:   Physical Exam        Assessment & Plan:

## 2017-05-04 ENCOUNTER — Encounter: Payer: Self-pay | Admitting: Physician Assistant

## 2017-05-21 ENCOUNTER — Telehealth: Payer: Self-pay | Admitting: Obstetrics and Gynecology

## 2017-05-21 NOTE — Telephone Encounter (Signed)
Please let the patient know that I reviewed her mammogram report from 5/18, the recommendation if for a screening mammogram (not diagnostic). Please have her schedule herself for a screening mammogram, but I would  recommend she ask for the 3D mammogram

## 2017-05-22 NOTE — Telephone Encounter (Signed)
Tried to contact patient, phone seems to be out of service at this time. I will try back later -eh

## 2017-05-25 NOTE — Telephone Encounter (Signed)
Left message to return call -eh

## 2017-05-27 ENCOUNTER — Other Ambulatory Visit: Payer: Self-pay | Admitting: Obstetrics and Gynecology

## 2017-05-27 ENCOUNTER — Encounter: Payer: Self-pay | Admitting: Obstetrics and Gynecology

## 2017-05-27 ENCOUNTER — Telehealth: Payer: Self-pay | Admitting: Obstetrics and Gynecology

## 2017-05-27 DIAGNOSIS — Z1231 Encounter for screening mammogram for malignant neoplasm of breast: Secondary | ICD-10-CM

## 2017-05-27 NOTE — Telephone Encounter (Signed)
Spoke with patient and gave recommendations. Patient was given the number for the [facility]

## 2017-05-27 NOTE — Telephone Encounter (Signed)
Patient called requesting prior mammogram records be sent to [facility].  Cc: Seth Bake

## 2017-06-10 DIAGNOSIS — S90562A Insect bite (nonvenomous), left ankle, initial encounter: Secondary | ICD-10-CM | POA: Diagnosis not present

## 2017-06-24 ENCOUNTER — Ambulatory Visit (INDEPENDENT_AMBULATORY_CARE_PROVIDER_SITE_OTHER): Payer: BLUE CROSS/BLUE SHIELD | Admitting: Family Medicine

## 2017-06-24 ENCOUNTER — Encounter: Payer: Self-pay | Admitting: Family Medicine

## 2017-06-24 VITALS — BP 100/72 | HR 64 | Temp 97.8°F | Ht 70.0 in | Wt 157.4 lb

## 2017-06-24 DIAGNOSIS — Z0000 Encounter for general adult medical examination without abnormal findings: Secondary | ICD-10-CM

## 2017-06-24 DIAGNOSIS — Z789 Other specified health status: Secondary | ICD-10-CM

## 2017-06-24 DIAGNOSIS — E785 Hyperlipidemia, unspecified: Secondary | ICD-10-CM | POA: Insufficient documentation

## 2017-06-24 NOTE — Patient Instructions (Addendum)
Health Maintenance Due  Topic Date Due  . TETANUS/TDAP - Sign release of information at the check out desk for immunizations from prior physician 10/15/1978  . MAMMOGRAM - Scheduled for June  10/14/2009  . COLONOSCOPY -we will also request this with records 10/14/2009   Please stop by front desk- we ended up doing a physical today because you are so healthy- you should have your copay refunded .   Please stop by lab before you go

## 2017-06-24 NOTE — Assessment & Plan Note (Signed)
Patient's gynecologist was kind enough to get lipids.  We reviewed lipids.  She has an excellent HDL.  Her 10-year risk for ASCVD risk calculation is under 2%.  Continue healthy diet and regular exercise.  Strongly doubt she will need statin anytime soon as long as she continues her healthy habits

## 2017-06-24 NOTE — Progress Notes (Signed)
Phone: [phone]  Subjective:  Patient presents today to establish care.  Prior patient in [place], [place]. Chief complaint-noted.   See problem oriented charting  The following were reviewed and entered/updated in epic: Past Medical History:  Diagnosis Date  . Hyperlipidemia    Patient Active Problem List   Diagnosis Date Noted  . Hyperlipidemia, unspecified 06/24/2017  . Lateral epicondylitis of right elbow 03/09/2017   Past Surgical History:  Procedure Laterality Date  . CYST EXCISION     benign- right upper arm  . WISDOM TOOTH EXTRACTION      Family History  Problem Relation Age of Onset  . Brain cancer Mother        glioblastoma  . Hyperlipidemia Mother   . Heart disease Father        37. cigar smoker.   . Lung cancer Sister   . Multiple myeloma Sister   . Heart disease Brother        87s. heavy smoker.   . Depression Daughter   . Healthy Sister     Medications- reviewed and updated Current Outpatient Medications  Medication Sig Dispense Refill  . diclofenac sodium (VOLTAREN) 1 % GEL Apply topically 4 (four) times daily.     No current facility-administered medications for this visit.     Allergies-reviewed and updated No Known Allergies  Social History   Social History Narrative   Married.  Son 64 and daughter 27 both in [place]. Son in business school. Daughter getting masters in [place]   Just moved here from [place] with husband.       Works as Optometrist- with nonprofits in art fields    ROS--Full ROS was completed Review of Systems  Constitutional: Negative for chills and fever.  HENT: Negative for ear discharge and ear pain.   Eyes: Negative for blurred vision and double vision.  Respiratory: Negative for cough and hemoptysis.   Cardiovascular: Negative for chest pain and palpitations.  Gastrointestinal: Negative for heartburn and nausea.  Genitourinary: Negative for dysuria and urgency.       Painful sex- using vaginal  lubricant  Musculoskeletal: Positive for back pain, joint pain and myalgias.       All of these drastically improved with pilates and chiropractor  Skin: Negative for itching and rash.  Neurological: Negative for dizziness and headaches.  Endo/Heme/Allergies: Negative for polydipsia. Does not bruise/bleed easily.       Some heat intolerance- always warm- over last 5 years  Psychiatric/Behavioral: Negative for hallucinations and substance abuse.   Objective: BP 100/72 (BP Location: Left Arm, Patient Position: Sitting, Cuff Size: Normal)   Pulse 64   Temp 97.8 F (36.6 C) (Oral)   Ht 5' 10" (1.778 m)   Wt 157 lb 6.4 oz (71.4 kg)   SpO2 97%   BMI 22.58 kg/m  Gen: NAD, resting comfortably, healthy weight HEENT: Mucous membranes are moist. Oropharynx normal. TM normal. Eyes: sclera and lids normal, PERRLA Neck: no thyromegaly, no cervical lymphadenopathy CV: RRR no murmurs rubs or gallops Lungs: CTAB no crackles, wheeze, rhonchi Abdomen: soft/nontender/nondistended/normal bowel sounds. No rebound or guarding.  Ext: no edema Skin: warm, dry Neuro: 5/5 strength in upper and lower extremities, normal gait, normal reflexes  Assessment/Plan:   57 y.o. female presenting for annual physical.  Health Maintenance counseling: 1. Anticipatory guidance: Patient counseled regarding regular dental exams -q6 months, eye exams - yearly, wearing seatbelts.  2. Risk factor reduction:  Advised patient of need for regular exercise and diet rich and  fruits and vegetables to reduce risk of heart attack and stroke. Exercise- at least 3x a week. Diet-very healthy diet.  Wt Readings from Last 3 Encounters:  06/24/17 157 lb 6.4 oz (71.4 kg)  05/01/17 160 lb 9.6 oz (72.8 kg)  04/22/17 161 lb (73 kg)   3. Immunizations/screenings/ancillary studies- will get records for Tdap. Will check immunity for measles.  Declines hepatitis C and HIV screening 4. Cervical cancer screening-  2019 with Dr. Talbert Nan 5.  Breast cancer screening-  breast exam with Dr. Talbert Nan and mammogram scheduled in June 6. Colon cancer screening - colonoscopy around age 31- no polyps- will get records 7. Skin cancer screening- no dermatologist right now. advised regular sunscreen use. Denies worrisome, changing, or new skin sions.  8. Birth control/STD check- postmenopausal and monogamous  9. Osteoporosis screening at 15- will plan on this.   Status of chronic or acute concerns   Spinal tension. Neck and low back tension with stress. Also had Tennis elbow with this. used voltaren gel with ortho. Found right Scientist, product/process development. [facility]- has been helpful.   Has some heat intolerance-offered TSH.  Has no other symptoms of hypothyroidism- she declines for now  Hyperlipidemia, unspecified Patient's gynecologist was kind enough to get lipids.  We reviewed lipids.  She has an excellent HDL.  Her 10-year risk for ASCVD risk calculation is under 2%.  Continue healthy diet and regular exercise.  Strongly doubt she will need statin anytime soon as long as she continues her healthy habits  Future Appointments  Date Time Provider [place]  07/20/2017  8:00 AM [facility] 3 [facility]  05/12/2018  1:15 PM Salvadore Dom, MD [place] None   Return in about 1 year (around 06/25/2018) for physical.  Lab/Order associations: Preventative health care  Measles, mumps, rubella (MMR) vaccination status unknown - Plan: Measles/Mumps/Rubella Immunity  Return precautions advised.  Garret Reddish, MD

## 2017-06-25 LAB — MEASLES/MUMPS/RUBELLA IMMUNITY
Mumps IgG: 144 AU/mL
Rubella: 5.89 index

## 2017-06-25 NOTE — Progress Notes (Signed)
Immune to mumps and rubella.  Not immune to measles.  Have her check with insurance about coverage for the MMR vaccination.  If it is covered- lets have her in for vaccination with MMR

## 2017-07-03 ENCOUNTER — Telehealth: Payer: Self-pay | Admitting: Family Medicine

## 2017-07-03 NOTE — Telephone Encounter (Signed)
Confirmed in patient's chart that she has already had her titers drawn. Her lab results show she is not immune to Measles. I scheduled her for a nurse visit on Monday, 07/06/2017

## 2017-07-03 NOTE — Telephone Encounter (Signed)
Patient requesting a measles vaccine and that she had already contacted her insurance who said they would cover. I spoke with Cleda Clarks who said that she would need to check the patients titers first before scheduling.   I explained this to the patient who was not clear on what the titers were and why that needed to be done prior to scheduling. I told her that I would pass this information on to [place] to call to clarify and schedule if possible.

## 2017-07-06 ENCOUNTER — Ambulatory Visit (INDEPENDENT_AMBULATORY_CARE_PROVIDER_SITE_OTHER): Payer: BLUE CROSS/BLUE SHIELD

## 2017-07-06 ENCOUNTER — Encounter: Payer: Self-pay | Admitting: Family Medicine

## 2017-07-06 DIAGNOSIS — Z23 Encounter for immunization: Secondary | ICD-10-CM | POA: Diagnosis not present

## 2017-07-10 ENCOUNTER — Telehealth: Payer: Self-pay

## 2017-07-10 NOTE — Telephone Encounter (Signed)
Called and left a voicemail message.

## 2017-07-10 NOTE — Telephone Encounter (Signed)
Patient called back to speak with CMA. Please call pt back thanks.

## 2017-07-10 NOTE — Telephone Encounter (Signed)
Copied from [place] [id number]. Topic: Inquiry >> Jul 10, 2017  3:11 PM Oliver Pila B wrote: Reason for CRM: pt called and had a personal confidential question to ask Dr. Yong Channel only, call pt to advise

## 2017-07-15 NOTE — Telephone Encounter (Signed)
Called and left a voicemail message asking for a return phone call 

## 2017-07-16 DIAGNOSIS — H5213 Myopia, bilateral: Secondary | ICD-10-CM | POA: Diagnosis not present

## 2017-07-20 ENCOUNTER — Ambulatory Visit
Admission: RE | Admit: 2017-07-20 | Discharge: 2017-07-20 | Disposition: A | Discharge status: Home / Self Care | Payer: BLUE CROSS/BLUE SHIELD | Source: Ambulatory Visit | Attending: Obstetrics and Gynecology | Admitting: Obstetrics and Gynecology

## 2017-07-20 DIAGNOSIS — Z1231 Encounter for screening mammogram for malignant neoplasm of breast: Secondary | ICD-10-CM | POA: Diagnosis not present

## 2017-08-03 ENCOUNTER — Ambulatory Visit (INDEPENDENT_AMBULATORY_CARE_PROVIDER_SITE_OTHER): Payer: BLUE CROSS/BLUE SHIELD

## 2017-08-03 DIAGNOSIS — Z23 Encounter for immunization: Secondary | ICD-10-CM | POA: Diagnosis not present

## 2017-08-03 NOTE — Progress Notes (Signed)
Patient in today for MMR vaccine. Administered in right arm and patient tolerated well. VIS given

## 2017-08-06 DIAGNOSIS — D2272 Melanocytic nevi of left lower limb, including hip: Secondary | ICD-10-CM | POA: Diagnosis not present

## 2017-08-06 DIAGNOSIS — D225 Melanocytic nevi of trunk: Secondary | ICD-10-CM | POA: Diagnosis not present

## 2017-08-06 DIAGNOSIS — L821 Other seborrheic keratosis: Secondary | ICD-10-CM | POA: Diagnosis not present

## 2017-08-06 DIAGNOSIS — D2271 Melanocytic nevi of right lower limb, including hip: Secondary | ICD-10-CM | POA: Diagnosis not present

## 2017-08-11 ENCOUNTER — Encounter: Payer: Self-pay | Admitting: Family Medicine

## 2017-08-17 ENCOUNTER — Encounter: Payer: Self-pay | Admitting: Family Medicine

## 2018-01-04 ENCOUNTER — Ambulatory Visit: Payer: BLUE CROSS/BLUE SHIELD | Admitting: Physician Assistant

## 2018-01-04 ENCOUNTER — Encounter: Payer: Self-pay | Admitting: Family Medicine

## 2018-01-04 ENCOUNTER — Ambulatory Visit (INDEPENDENT_AMBULATORY_CARE_PROVIDER_SITE_OTHER): Payer: BLUE CROSS/BLUE SHIELD | Admitting: Family Medicine

## 2018-01-04 VITALS — BP 108/72 | HR 66 | Temp 97.6°F | Ht 70.0 in | Wt 162.4 lb

## 2018-01-04 DIAGNOSIS — J40 Bronchitis, not specified as acute or chronic: Secondary | ICD-10-CM | POA: Diagnosis not present

## 2018-01-04 MED ORDER — GUAIFENESIN-CODEINE 100-10 MG/5ML PO SOLN: 10.0000 mL | Freq: Four times a day (QID) | ORAL | 0 refills | Status: DC | PRN

## 2018-01-04 MED ORDER — AZITHROMYCIN 250 MG PO TABS: ORAL_TABLET | ORAL | 0 refills | Status: DC

## 2018-01-04 MED ORDER — BENZONATATE 200 MG PO CAPS: 200.0000 mg | ORAL_CAPSULE | Freq: Three times a day (TID) | ORAL | 0 refills | Status: DC | PRN

## 2018-01-04 NOTE — Patient Instructions (Signed)
Honey by tablespoon if can tolerate Tessalon pearls TID as needed Codeine cough syrup as needed I like robitussin DM during the day Also sending zpack to start (antibiotics) if fever/not getting better.    Acute Bronchitis, Adult Acute bronchitis is when air tubes (bronchi) in the lungs suddenly get swollen. The condition can make it hard to breathe. It can also cause these symptoms:  A cough.  Coughing up clear, yellow, or green mucus.  Wheezing.  Chest congestion.  Shortness of breath.  A fever.  Body aches.  Chills.  A sore throat.  Follow these instructions at home: Medicines  Take over-the-counter and prescription medicines only as told by your doctor.  If you were prescribed an antibiotic medicine, take it as told by your doctor. Do not stop taking the antibiotic even if you start to feel better. General instructions  Rest.  Drink enough fluids to keep your pee (urine) clear or pale yellow.  Avoid smoking and secondhand smoke. If you smoke and you need help quitting, ask your doctor. Quitting will help your lungs heal faster.  Use an inhaler, cool mist vaporizer, or humidifier as told by your doctor.  Keep all follow-up visits as told by your doctor. This is important. How is this prevented? To lower your risk of getting this condition again:  Wash your hands often with soap and water. If you cannot use soap and water, use hand sanitizer.  Avoid contact with people who have cold symptoms.  Try not to touch your hands to your mouth, nose, or eyes.  Make sure to get the flu shot every year.  Contact a doctor if:  Your symptoms do not get better in 2 weeks. Get help right away if:  You cough up blood.  You have chest pain.  You have very bad shortness of breath.  You become dehydrated.  You faint (pass out) or keep feeling like you are going to pass out.  You keep throwing up (vomiting).  You have a very bad headache.  Your fever or chills  gets worse. This information is not intended to replace advice given to you by your health care provider. Make sure you discuss any questions you have with your health care provider. Document Released: 07/23/2007 Document Revised: 09/12/2015 Document Reviewed: 07/25/2015 Elsevier Interactive Patient Education  [employer].

## 2018-01-04 NOTE — Progress Notes (Signed)
Patient: Christine Medina MRN: [id number] DOB: 1960-05-07 PCP: Marin Olp, MD     Subjective:  Chief Complaint  Patient presents with  . Cough    x 1 week    HPI: The patient is a 57 y.o. female who presents today for cough x 1 week. She states she is really healthy and feels like this is taking it's time and "nestling deep." she has tried to lay low this weekend, but still was quite busy. She states she had a bad pneumonia "many moons ago" and she feels like she has a lingering cough everytime she gets this. She denies any fever/chills. She states her husband was sick a few weeks ago, but he got better quite quickly. No sick contacts, but everywhere she goes she is around sick people. She denies any shortness of breath or wheezing. She is not around smokers and does not smoke herself. She has taken some codeine cough syrup and this does help her. She had 2 massive cough attacks in between 2 meetings and took this medication. She has taken aleve every other day.    Review of Systems  Constitutional: Positive for chills and fatigue. Negative for fever.  HENT: Positive for congestion, postnasal drip and rhinorrhea. Negative for ear pain, sinus pressure, sneezing and sore throat.   Respiratory: Positive for cough. Negative for chest tightness, shortness of breath and wheezing.   Cardiovascular: Negative for chest pain.  Gastrointestinal: Negative for abdominal pain and nausea.  Musculoskeletal: Negative for back pain and neck pain.  Neurological: Negative for dizziness and headaches.  Psychiatric/Behavioral: Positive for sleep disturbance.    Allergies Patient has No Known Allergies.  Past Medical History Patient  has a past medical history of Hyperlipidemia.  Surgical History Patient  has a past surgical history that includes Wisdom tooth extraction and Cyst excision.  Family History Pateint's family history includes Brain cancer in her mother; Breast cancer in her maternal  aunt; Depression in her daughter; Healthy in her sister; Heart disease in her brother and father; Hyperlipidemia in her mother; Lung cancer in her sister; Multiple myeloma in her sister.  Social History Patient  reports that she quit smoking about 28 years ago. Her smoking use included cigarettes. She has a 2.50 pack-year smoking history. She has never used smokeless tobacco. She reports that she drinks about 10.0 standard drinks of alcohol per week. She reports that she does not use drugs.    Objective: Vitals:   01/04/18 1346  BP: 108/72  Pulse: 66  Temp: 97.6 F (36.4 C)  TempSrc: Oral  SpO2: 96%  Weight: 162 lb 6.4 oz (73.7 kg)  Height: '5\' 10"'$  (1.778 m)    Body mass index is 23.3 kg/m.  Physical Exam  Constitutional: She is oriented to person, place, and time. She appears well-developed and well-nourished.  HENT:  Right Ear: External ear normal.  Left Ear: External ear normal.  Nose: Nose normal.  Mouth/Throat: Oropharynx is clear and moist.  Tm pearly with light reflex bilaterally    Eyes: Conjunctivae are normal.  Neck: Normal range of motion. Neck supple. No thyromegaly present.  Cardiovascular: Normal rate, regular rhythm and normal heart sounds.  Pulmonary/Chest: Effort normal and breath sounds normal. She has no wheezes. She has no rales.  Abdominal: Soft. Bowel sounds are normal.  Lymphadenopathy:    She has no cervical adenopathy.  Neurological: She is alert and oriented to person, place, and time.  Skin: Skin is warm and dry. Capillary refill takes less  than 2 seconds. No rash noted.  Vitals reviewed.      Assessment/plan: 1. Bronchitis Discussed exam is normal and likely viral in nature. Antibiotics contraindicated at this time. Conservative therapy recommended with honey daily, cool mist humidifier, robitussin DM and will send in tessalon pearls and codeine cough syrup at night. Discussed this can last for up to 6 weeks. Will send in zpack for her to start  only if fever/worsening symptoms. Let us know if she is not getting better or worsening symptoms despite treatment.    Return if symptoms worsen or fail to improve.   Orma Flaming, MD [place]   01/04/2018

## 2018-05-12 ENCOUNTER — Ambulatory Visit: Payer: BLUE CROSS/BLUE SHIELD | Admitting: Obstetrics and Gynecology

## 2018-06-22 ENCOUNTER — Other Ambulatory Visit: Payer: Self-pay | Admitting: Obstetrics and Gynecology

## 2018-06-22 DIAGNOSIS — Z1231 Encounter for screening mammogram for malignant neoplasm of breast: Secondary | ICD-10-CM

## 2018-06-28 ENCOUNTER — Encounter: Payer: BLUE CROSS/BLUE SHIELD | Admitting: Family Medicine

## 2018-07-19 NOTE — Progress Notes (Signed)
58 y.o. G21P2002 Married White or Caucasian Not Hispanic or Latino female here for annual exam.   GSI is about the same, leaks a little bit every day.  She would like to be 15 lbs lighter. She is exercising, walks ~1 hour a day. Trying to eat healthy, with her husband and son home for the last 10 weeks she is cooking more, eating more.  Sexually active, mild dyspareunia even with lubrication.      No LMP recorded (lmp unknown). Patient is postmenopausal.          Sexually active: Yes.    The current method of family planning is post menopausal status.    Exercising: Yes.    walking, yoga Smoker:  no  Health Maintenance: Pap:  04/22/2017 WNL NEG HPV History of abnormal Pap:  no MMG:  07/20/2017 Birads 1 negative Colonoscopy: 07/29/2010 WNL BMD:  PCP - Normal in [place]. TDaP:  06/13/2008 Gardasil: N/A   reports that she quit smoking about 29 years ago. Her smoking use included cigarettes. She has a 2.50 pack-year smoking history. She has never used smokeless tobacco. She reports current alcohol use of about 7.0 standard drinks of alcohol per week. She reports that she does not use drugs. Son is 44, going to school in [place], home since March with Covid 19. Daughter is 69, working in [place]. She runs a Emergency planning/management officer.   Past Medical History:  Diagnosis Date  . Hyperlipidemia     Past Surgical History:  Procedure Laterality Date  . CYST EXCISION     benign- right upper arm  . WISDOM TOOTH EXTRACTION      No current outpatient medications on file.   No current facility-administered medications for this visit.     Family History  Problem Relation Age of Onset  . Brain cancer Mother        glioblastoma  . Hyperlipidemia Mother   . Heart disease Father        66. cigar smoker.   . Lung cancer Sister   . Multiple myeloma Sister   . Heart disease Brother        22s. heavy smoker.   . Depression Daughter   . Healthy Sister   . Breast cancer Maternal Aunt     Review of Systems   Constitutional: Negative.   HENT: Negative.   Eyes: Negative.   Respiratory: Negative.   Cardiovascular: Negative.   Gastrointestinal: Negative.   Endocrine: Negative.   Genitourinary: Negative.   Musculoskeletal: Negative.   Skin: Negative.   Allergic/Immunologic: Negative.   Neurological: Negative.   Hematological: Negative.   Psychiatric/Behavioral: Negative.     Exam:   BP 120/82 (BP Location: Right Arm, Patient Position: Sitting, Cuff Size: Normal)   Pulse 60   Temp 98 F (36.7 C) (Skin)   Ht '5\' 10"'$  (1.778 m)   Wt 161 lb (73 kg)   LMP  (LMP Unknown)   BMI 23.10 kg/m   Weight change: '@WEIGHTCHANGE'$ @ Height:   Height: '5\' 10"'$  (177.8 cm)  Ht Readings from Last 3 Encounters:  07/20/18 '5\' 10"'$  (1.778 m)  01/04/18 '5\' 10"'$  (1.778 m)  06/24/17 '5\' 10"'$  (1.778 m)    General appearance: alert, cooperative and appears stated age Head: Normocephalic, without obvious abnormality, atraumatic Neck: no adenopathy, supple, symmetrical, trachea midline and thyroid normal to inspection and palpation Lungs: clear to auscultation bilaterally Cardiovascular: regular rate and rhythm Breasts: normal appearance, no masses or tenderness Abdomen: soft, non-tender; non distended,  no masses,  no organomegaly Extremities: extremities normal, atraumatic, no cyanosis or edema Skin: Skin color, texture, turgor normal. No rashes or lesions Lymph nodes: Cervical, supraclavicular, and axillary nodes normal. No abnormal inguinal nodes palpated Neurologic: Grossly normal   Pelvic: External genitalia:  no lesions              Urethra:  normal appearing urethra with no masses, tenderness or lesions              Bartholins and Skenes: normal                 Vagina: mildly atrophic appearing vagina with normal color and discharge, no lesions. No significant prolapse              Cervix: no lesions               Bimanual Exam:  Uterus:  normal size, contour, position, consistency, mobility, non-tender               Adnexa: no mass, fullness, tenderness               Rectovaginal: Confirms               Anus:  normal sphincter tone, no lesions  Chaperone was present for exam.  A:  Well Woman with normal exam  GSI  Vaginal atrophy, mild dyspareunia   P:   She will do her screening labs with her primary  No pap this year  TDAP  Colonoscopy UTD  DEXA at 89  Discussed breast self exam  Discussed calcium and vit D intake  Kegel information given, discussed PT and surgery as options.  Start vaginal estrogen, discussed risks

## 2018-07-20 ENCOUNTER — Encounter: Payer: Self-pay | Admitting: Obstetrics and Gynecology

## 2018-07-20 ENCOUNTER — Other Ambulatory Visit: Payer: Self-pay

## 2018-07-20 ENCOUNTER — Ambulatory Visit: Payer: Managed Care, Other (non HMO) | Admitting: Obstetrics and Gynecology

## 2018-07-20 VITALS — BP 120/82 | HR 60 | Temp 98.0°F | Ht 70.0 in | Wt 161.0 lb

## 2018-07-20 DIAGNOSIS — N393 Stress incontinence (female) (male): Secondary | ICD-10-CM

## 2018-07-20 DIAGNOSIS — Z23 Encounter for immunization: Secondary | ICD-10-CM | POA: Diagnosis not present

## 2018-07-20 DIAGNOSIS — N952 Postmenopausal atrophic vaginitis: Secondary | ICD-10-CM | POA: Diagnosis not present

## 2018-07-20 DIAGNOSIS — Z01419 Encounter for gynecological examination (general) (routine) without abnormal findings: Secondary | ICD-10-CM

## 2018-07-20 MED ORDER — ESTRADIOL 10 MCG VA TABS: ORAL_TABLET | VAGINAL | 3 refills | Status: DC

## 2018-07-20 NOTE — Patient Instructions (Addendum)
EXERCISE AND DIET:  We recommended that you start or continue a regular exercise program for good health. Regular exercise means any activity that makes your heart beat faster and makes you sweat.  We recommend exercising at least 30 minutes per day at least 3 days a week, preferably 4 or 5.  We also recommend a diet low in fat and sugar.  Inactivity, poor dietary choices and obesity can cause diabetes, heart attack, stroke, and kidney damage, among others.   ° °ALCOHOL AND SMOKING:  Women should limit their alcohol intake to no more than 7 drinks/beers/glasses of wine (combined, not each!) per week. Moderation of alcohol intake to this level decreases your risk of breast cancer and liver damage. And of course, no recreational drugs are part of a healthy lifestyle.  And absolutely no smoking or even second hand smoke. Most people know smoking can cause heart and lung diseases, but did you know it also contributes to weakening of your bones? Aging of your skin?  Yellowing of your teeth and nails? ° °CALCIUM AND VITAMIN D:  Adequate intake of calcium and Vitamin D are recommended.  The recommendations for exact amounts of these supplements seem to change often, but generally speaking 1,200 mg of calcium (between diet and supplement) and 800 units of Vitamin D per day seems prudent. Certain women may benefit from higher intake of Vitamin D.  If you are among these women, your doctor will have told you during your visit.   ° °PAP SMEARS:  Pap smears, to check for cervical cancer or precancers,  have traditionally been done yearly, although recent scientific advances have shown that most women can have pap smears less often.  However, every woman still should have a physical exam from her gynecologist every year. It will include a breast check, inspection of the vulva and vagina to check for abnormal growths or skin changes, a visual exam of the cervix, and then an exam to evaluate the size and shape of the uterus and  ovaries.  And after 59 years of age, a rectal exam is indicated to check for rectal cancers. We will also provide age appropriate advice regarding health maintenance, like when you should have certain vaccines, screening for sexually transmitted diseases, bone density testing, colonoscopy, mammograms, etc.  ° °MAMMOGRAMS:  All women over 40 years old should have a yearly mammogram. Many facilities now offer a "3D" mammogram, which may cost around $50 extra out of pocket. If possible,  we recommend you accept the option to have the 3D mammogram performed.  It both reduces the number of women who will be called back for extra views which then turn out to be normal, and it is better than the routine mammogram at detecting truly abnormal areas.   ° °COLON CANCER SCREENING: Now recommend starting at age 45. At this time colonoscopy is not covered for routine screening until 50. There are take home tests that can be done between 45-49.  ° °COLONOSCOPY:  Colonoscopy to screen for colon cancer is recommended for all women at age 50.  We know, you hate the idea of the prep.  We agree, BUT, having colon cancer and not knowing it is worse!!  Colon cancer so often starts as a polyp that can be seen and removed at colonscopy, which can quite literally save your life!  And if your first colonoscopy is normal and you have no family history of colon cancer, most women don't have to have it again for   10 years.  Once every ten years, you can do something that may end up saving your life, right?  We will be happy to help you get it scheduled when you are ready.  Be sure to check your insurance coverage so you understand how much it will cost.  It may be covered as a preventative service at no cost, but you should check your particular policy.   ° ° ° °Breast Self-Awareness °Breast self-awareness means being familiar with how your breasts look and feel. It involves checking your breasts regularly and reporting any changes to your  health care provider. °Practicing breast self-awareness is important. A change in your breasts can be a sign of a serious medical problem. Being familiar with how your breasts look and feel allows you to find any problems early, when treatment is more likely to be successful. All women should practice breast self-awareness, including women who have had breast implants. °How to do a breast self-exam °One way to learn what is normal for your breasts and whether your breasts are changing is to do a breast self-exam. To do a breast self-exam: °Look for Changes ° °1. Remove all the clothing above your waist. °2. Stand in front of a mirror in a room with good lighting. °3. Put your hands on your hips. °4. Push your hands firmly downward. °5. Compare your breasts in the mirror. Look for differences between them (asymmetry), such as: °? Differences in shape. °? Differences in size. °? Puckers, dips, and bumps in one breast and not the other. °6. Look at each breast for changes in your skin, such as: °? Redness. °? Scaly areas. °7. Look for changes in your nipples, such as: °? Discharge. °? Bleeding. °? Dimpling. °? Redness. °? A change in position. °Feel for Changes °Carefully feel your breasts for lumps and changes. It is best to do this while lying on your back on the floor and again while sitting or standing in the shower or tub with soapy water on your skin. Feel each breast in the following way: °· Place the arm on the side of the breast you are examining above your head. °· Feel your breast with the other hand. °· Start in the nipple area and make ¾ inch (2 cm) overlapping circles to feel your breast. Use the pads of your three middle fingers to do this. Apply light pressure, then medium pressure, then firm pressure. The light pressure will allow you to feel the tissue closest to the skin. The medium pressure will allow you to feel the tissue that is a little deeper. The firm pressure will allow you to feel the tissue  close to the ribs. °· Continue the overlapping circles, moving downward over the breast until you feel your ribs below your breast. °· Move one finger-width toward the center of the body. Continue to use the ¾ inch (2 cm) overlapping circles to feel your breast as you move slowly up toward your collarbone. °· Continue the up and down exam using all three pressures until you reach your armpit. ° °Write Down What You Find ° °Write down what is normal for each breast and any changes that you find. Keep a written record with breast changes or normal findings for each breast. By writing this information down, you do not need to depend only on memory for size, tenderness, or location. Write down where you are in your menstrual cycle, if you are still menstruating. °If you are having trouble noticing differences   in your breasts, do not get discouraged. With time you will become more familiar with the variations in your breasts and more comfortable with the exam. How often should I examine my breasts? Examine your breasts every month. If you are breastfeeding, the best time to examine your breasts is after a feeding or after using a breast pump. If you menstruate, the best time to examine your breasts is 5-7 days after your period is over. During your period, your breasts are lumpier, and it may be more difficult to notice changes. When should I see my health care provider? See your health care provider if you notice:  A change in shape or size of your breasts or nipples.  A change in the skin of your breast or nipples, such as a reddened or scaly area.  Unusual discharge from your nipples.  A lump or thick area that was not there before.  Pain in your breasts.  Anything that concerns you.   Kegel Exercises Kegel exercises help strengthen the muscles that support the rectum, vagina, small intestine, bladder, and uterus. Doing Kegel exercises can help:  Improve bladder and bowel control.  Improve  sexual response.  Reduce problems and discomfort during pregnancy. Kegel exercises involve squeezing your pelvic floor muscles, which are the same muscles you squeeze when you try to stop the flow of urine. The exercises can be done while sitting, standing, or lying down, but it is best to vary your position. Exercises 1. Squeeze your pelvic floor muscles tight. You should feel a tight lift in your rectal area. If you are a female, you should also feel a tightness in your vaginal area. Keep your stomach, buttocks, and legs relaxed. 2. Hold the muscles tight for up to 10 seconds. 3. Relax your muscles. Repeat this exercise 50 times a day or as many times as told by your health care provider. Continue to do this exercise for at least 4-6 weeks or for as long as told by your health care provider. This information is not intended to replace advice given to you by your health care provider. Make sure you discuss any questions you have with your health care provider. Document Released: 01/21/2012 Document Revised: 06/16/2016 Document Reviewed: 12/24/2014 Elsevier Interactive Patient Education  2019 [employer].

## 2018-08-16 ENCOUNTER — Ambulatory Visit
Admission: RE | Admit: 2018-08-16 | Discharge: 2018-08-16 | Disposition: A | Discharge status: Home / Self Care | Payer: Managed Care, Other (non HMO) | Source: Ambulatory Visit | Attending: Obstetrics and Gynecology | Admitting: Obstetrics and Gynecology

## 2018-08-16 ENCOUNTER — Other Ambulatory Visit: Payer: Self-pay

## 2018-08-16 DIAGNOSIS — Z1231 Encounter for screening mammogram for malignant neoplasm of breast: Secondary | ICD-10-CM

## 2018-09-06 IMAGING — MG DIGITAL SCREENING BILATERAL MAMMOGRAM WITH TOMO AND CAD
8 series · 8 of 24 positions shown · non-contrast
Comparison: Previous exam(s).

CLINICAL DATA: Screening.

EXAM:
DIGITAL SCREENING BILATERAL MAMMOGRAM WITH TOMO AND CAD

[R MLO synth-2D]
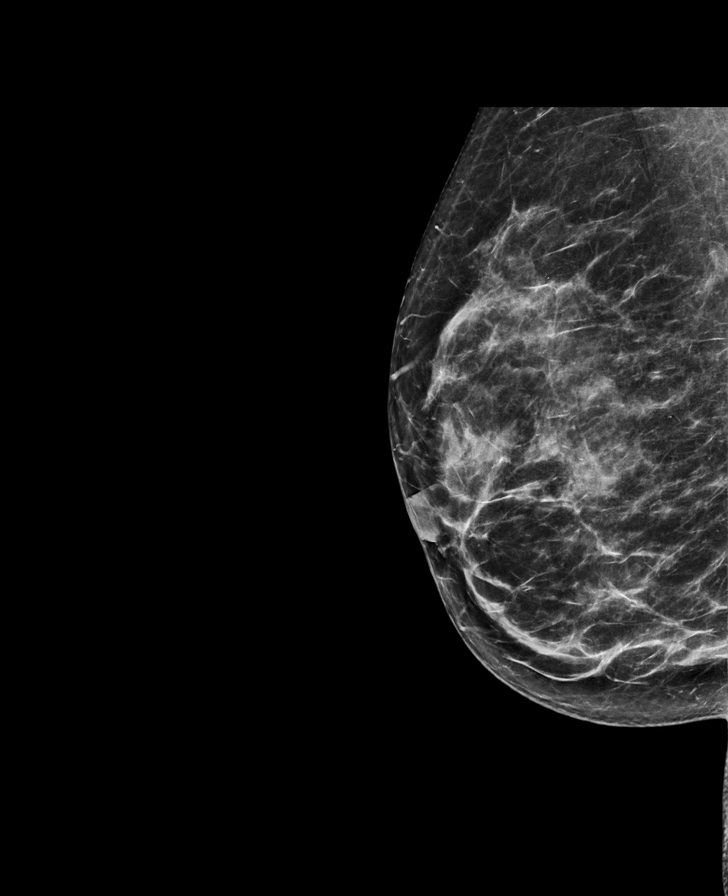

[L CC synth-2D]
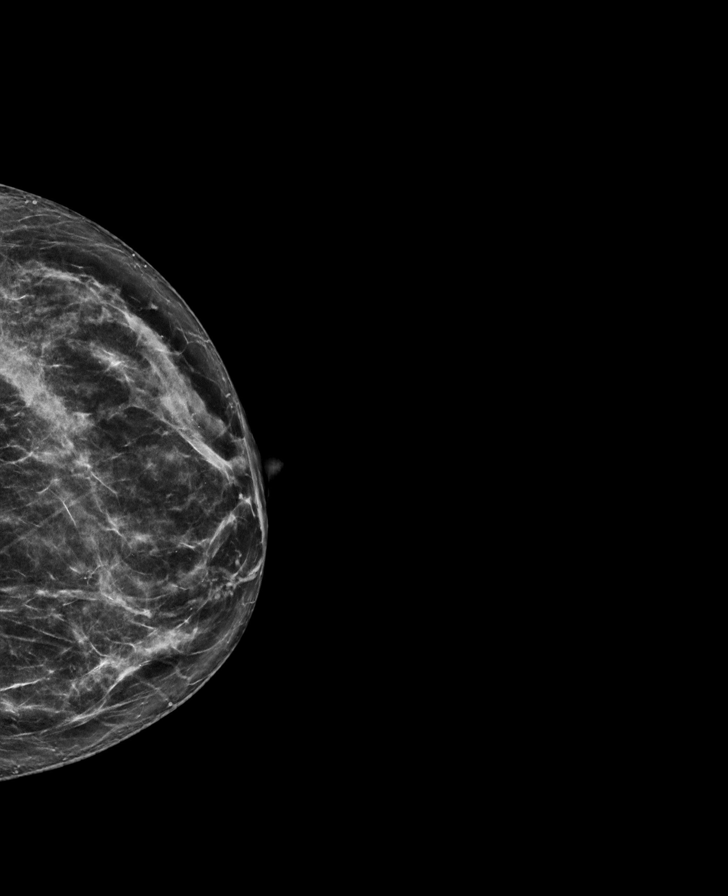

[L MLO synth-2D]
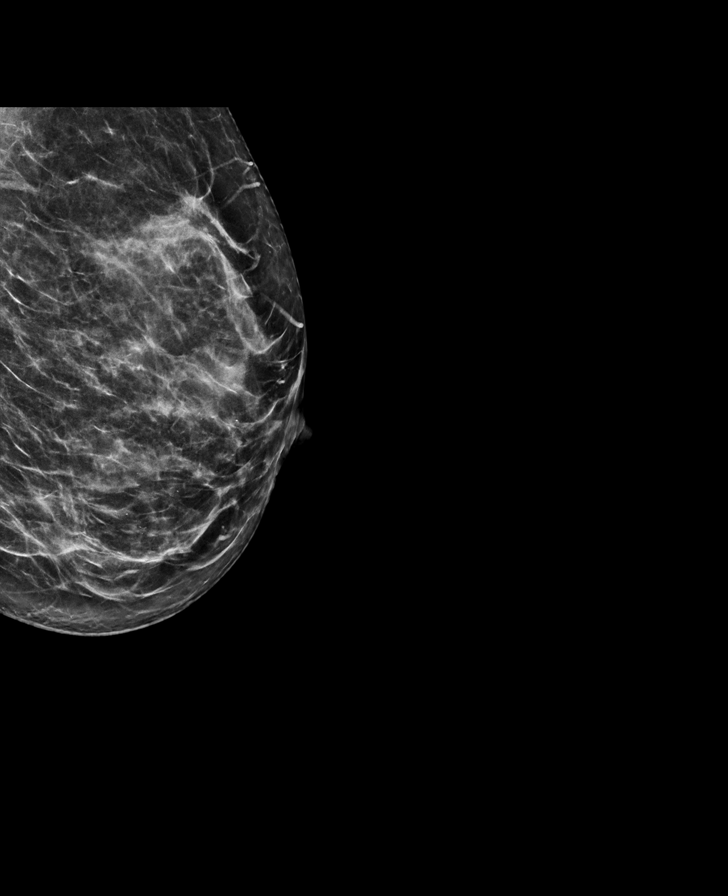

[R CC synth-2D]
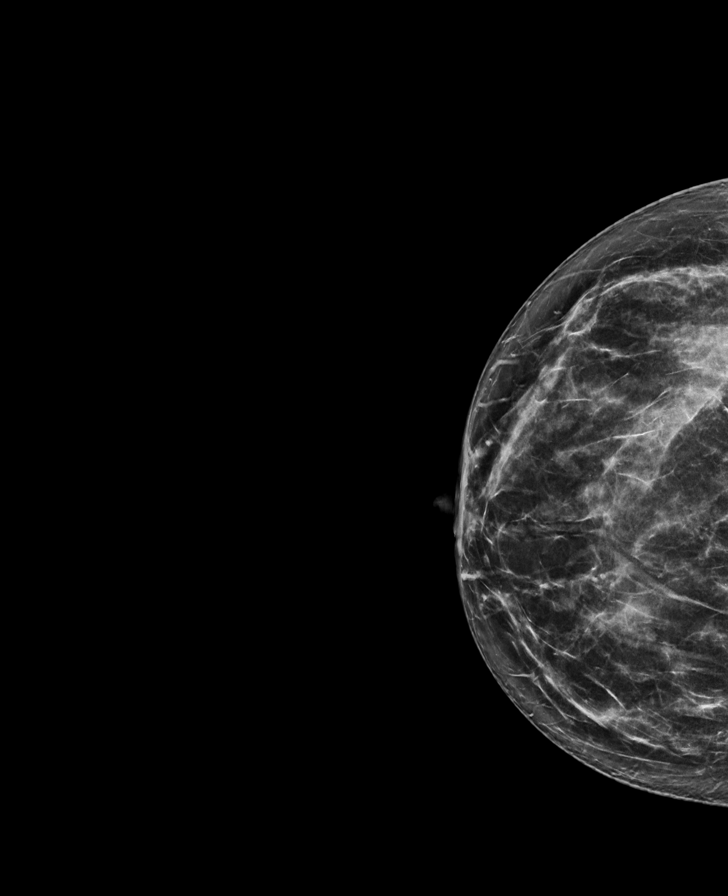

[R CC tomo · tomo slice 35/70.0]
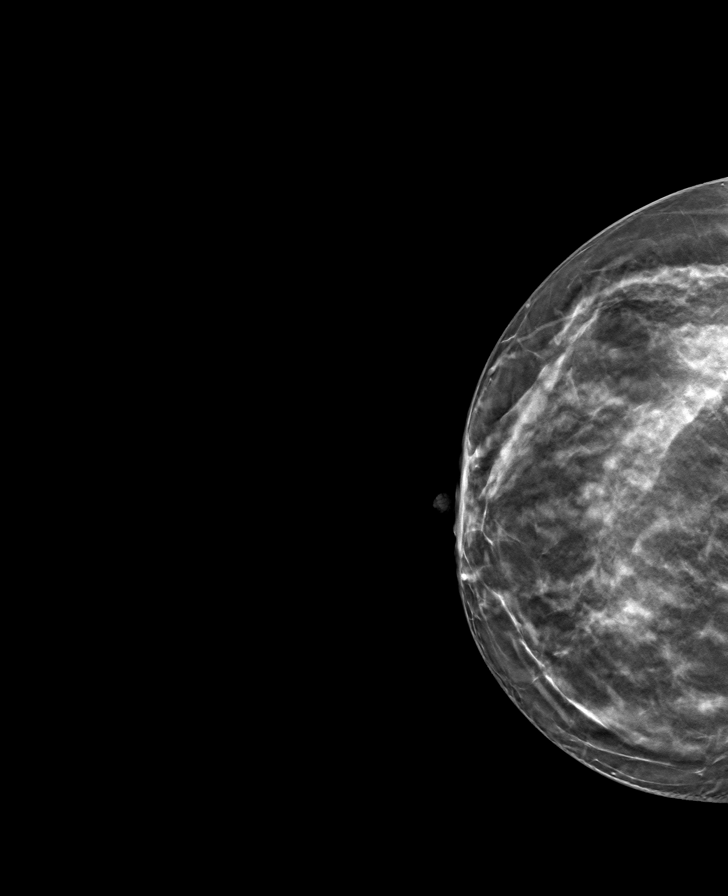

[R MLO tomo · tomo slice 35/70.0]
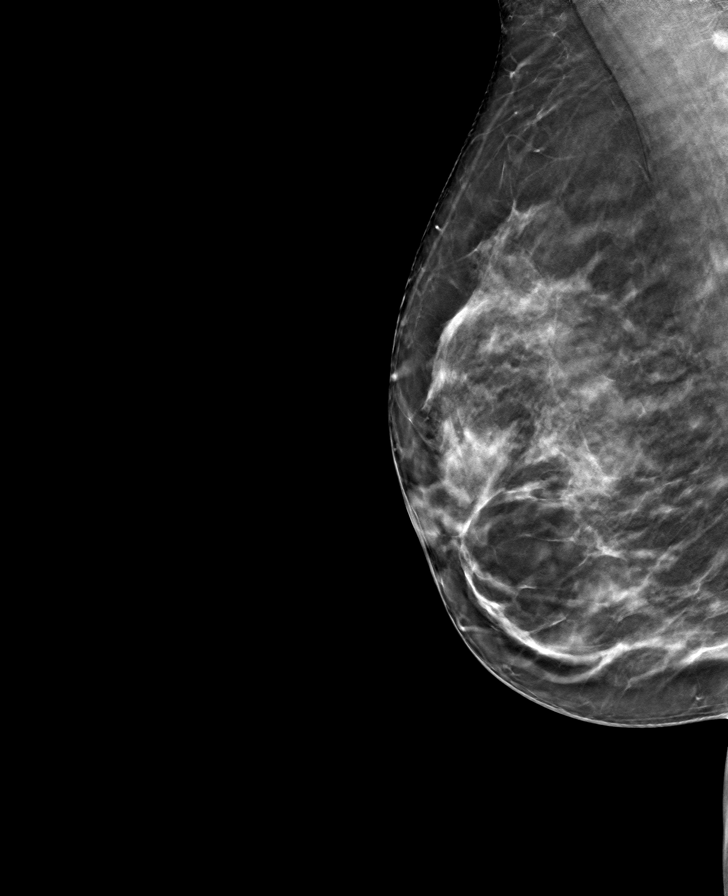

[L MLO tomo · tomo slice 36/71.0]
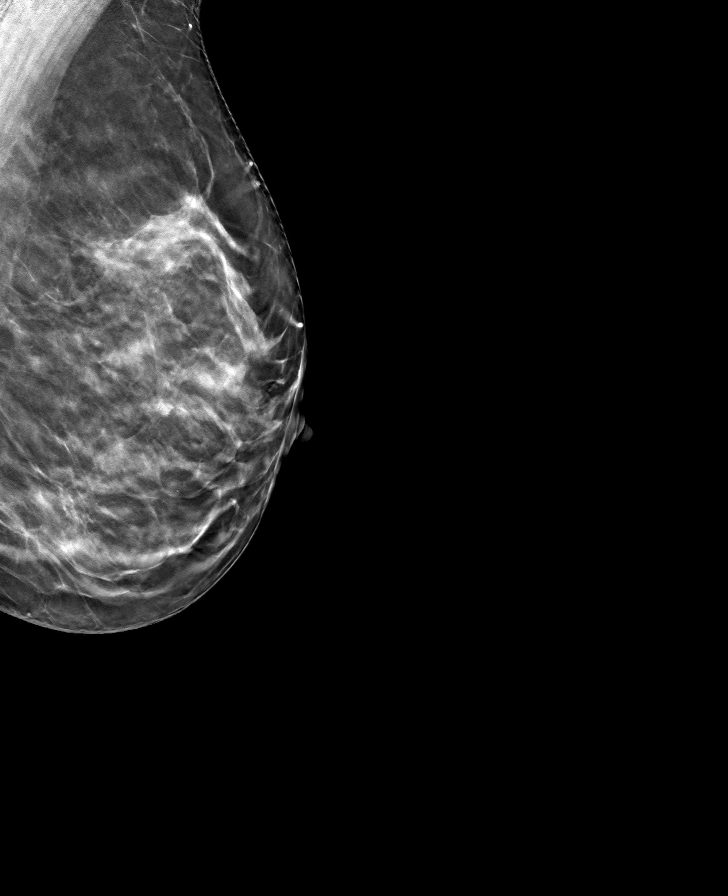

[L CC tomo · tomo slice 34/67.0]
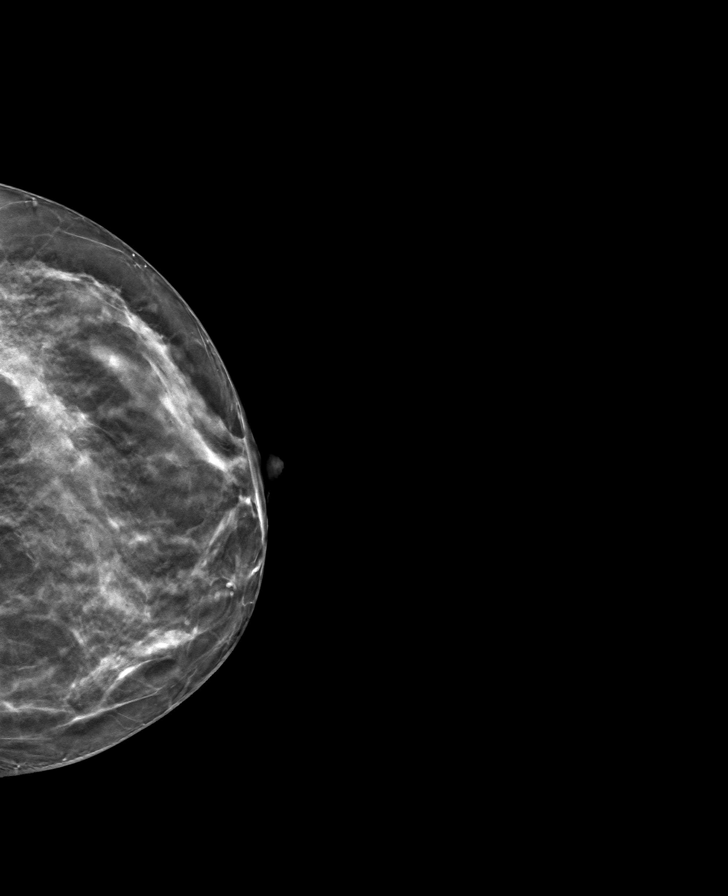

[8 of 24 positions shown; findings below may reference images not displayed]

ACR Breast Density Category c: The breast tissue is heterogeneously
dense, which may obscure small masses.
FINDINGS: There are no findings suspicious for malignancy. Images were
processed with CAD.
IMPRESSION: No mammographic evidence of malignancy. A result letter of this
screening mammogram will be mailed directly to the patient.

RECOMMENDATION:
Screening mammogram in one year. (Code:[id number])

BI-RADS CATEGORY  1: Negative.

## 2018-11-01 ENCOUNTER — Ambulatory Visit: Payer: Self-pay | Admitting: Family Medicine

## 2018-11-01 NOTE — Progress Notes (Signed)
Patient believed her appointment was at a later time- no-show as a result.  Rescheduled for October visit.

## 2018-11-01 NOTE — Patient Instructions (Signed)
Health Maintenance Due  Topic Date Due  . INFLUENZA VACCINE  09/18/2018    Depression screen PHQ 2/9 05/01/2017  Decreased Interest 0  Down, Depressed, Hopeless 0  PHQ - 2 Score 0

## 2018-12-09 NOTE — Progress Notes (Signed)
Visit rescheduled due to late arrival- LOS no charge

## 2018-12-09 NOTE — Patient Instructions (Signed)
Health Maintenance Due  Topic Date Due  . INFLUENZA VACCINE  09/18/2018   Depression screen PHQ 2/9 05/01/2017  Decreased Interest 0  Down, Depressed, Hopeless 0  PHQ - 2 Score 0

## 2018-12-14 ENCOUNTER — Ambulatory Visit: Payer: Managed Care, Other (non HMO) | Admitting: Family Medicine

## 2018-12-14 ENCOUNTER — Telehealth: Payer: Self-pay | Admitting: Family Medicine

## 2018-12-14 NOTE — Telephone Encounter (Signed)
Patient came in for her 2:40pm appointment at 2:57pm. I explained the 10 min late policy and that she would need to reschedule. I also mentioned that she would not be charged as she did make an attempt to come into the office. She stated that she was on a phone call in the car and then came in and waited. I explained that she waited in the lobby approximately 2 min as I placed a patient on hold to check her in. She said "we would have to split the hair." I told her that I'd be happy to help get her rescheduled which I was able to for 01/10/2019 and asked for her to arrive no later than 3:30pm to allow time for check in. She stated she understood. No further action required, for documentation purposes only.

## 2019-01-10 ENCOUNTER — Encounter: Payer: Self-pay | Admitting: Family Medicine

## 2019-01-10 ENCOUNTER — Other Ambulatory Visit: Payer: Self-pay

## 2019-01-10 ENCOUNTER — Ambulatory Visit (INDEPENDENT_AMBULATORY_CARE_PROVIDER_SITE_OTHER): Payer: Managed Care, Other (non HMO) | Admitting: Family Medicine

## 2019-01-10 VITALS — BP 110/70 | HR 67 | Temp 98.2°F | Ht 70.0 in | Wt 163.8 lb

## 2019-01-10 DIAGNOSIS — E785 Hyperlipidemia, unspecified: Secondary | ICD-10-CM | POA: Diagnosis not present

## 2019-01-10 DIAGNOSIS — Z0000 Encounter for general adult medical examination without abnormal findings: Secondary | ICD-10-CM

## 2019-01-10 NOTE — Patient Instructions (Addendum)
Thanks for doing labs today.   Thanks for taking such good care of your health!   Hope you have a Happy Thanksgiving!  If you decide there is a better time in the year- you can call for shingrix visits with nursing staff- otherwise we can consider at your next physical   Recommended follow [id number] in about 1 year (around 01/10/2020) for physical or sooner if needed.

## 2019-01-10 NOTE — Progress Notes (Signed)
Phone: [phone]   Subjective:  Patient presents today for their annual physical. Chief complaint-noted.   See problem oriented charting- Review of Systems  Constitutional: Negative.   HENT: Negative.   Eyes: Negative.   Respiratory: Negative.   Cardiovascular: Negative.   Gastrointestinal: Negative.   Genitourinary: Negative.   Musculoskeletal: Negative.   Skin: Negative.   Neurological: Negative.   Endo/Heme/Allergies: Negative.   Psychiatric/Behavioral: Negative.     The following were reviewed and entered/updated in epic: Past Medical History:  Diagnosis Date  . Hyperlipidemia    Patient Active Problem List   Diagnosis Date Noted  . Hyperlipidemia, unspecified 06/24/2017  . Lateral epicondylitis of right elbow 03/09/2017   Past Surgical History:  Procedure Laterality Date  . CYST EXCISION     benign- right upper arm  . WISDOM TOOTH EXTRACTION      Family History  Problem Relation Age of Onset  . Brain cancer Mother        glioblastoma  . Hyperlipidemia Mother   . Heart disease Father        46. cigar smoker.   . Lung cancer Sister   . Multiple myeloma Sister   . Heart disease Brother        45s. heavy smoker.   . Depression Daughter   . Healthy Sister   . Breast cancer Maternal Aunt     Medications- reviewed and updated Current Outpatient Medications  Medication Sig Dispense Refill  . Estradiol 10 MCG TABS vaginal tablet Place one tablet vaginally qhs x 1 week, then 2 x a week at hs (Patient not taking: Reported on 01/10/2019) 24 tablet 3   No current facility-administered medications for this visit.     Allergies-reviewed and updated No Known Allergies  Social History   Social History Narrative   Married.  Son 77 and daughter 10 both in [place]. Son in business school. Daughter getting masters in [place]   Just moved here from [place] with husband.       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 TM normal. Bridge of nose normal. Eyelids normal.  Neck: no thyromegaly or cervical lymphadenopathy  CV: RRR no murmurs rubs or gallops Lungs: CTAB no crackles, wheeze, rhonchi Abdomen: soft/nontender/nondistended/normal bowel sounds. No rebound or guarding.  Ext: no edema Skin: warm, dry Neuro: grossly normal, moves all extremities, PERRLA   Assessment and Plan   58 y.o. female presenting for annual physical.  Health Maintenance counseling: 1. Anticipatory guidance: Patient counseled regarding regular dental exams q6 months, eye exams- yearly,  avoiding smoking and second hand smoke , limiting alcohol to 1-2 beverage per day - encouraged limit to 1 per day.  2. Risk factor reduction:  Advised patient of need for regular exercise and diet rich and fruits and vegetables to reduce risk of heart attack and stroke. Exercise- daily walking.also does pilates. Diet- working on eating healthier options. Weight largely stable over last year.  Wt Readings from Last 3 Encounters:  01/10/19 163 lb 12.8 oz (74.3 kg)  07/20/18 161 lb (73 kg)  01/04/18 162  lb 6.4 oz (73.7 kg)  3. Immunizations/screenings/ancillary studies-up-to-date other than Shingrix- wants to defer for now- reconsider next year.   Immunization History  Administered Date(s) Administered  . Influenza,inj,Quad PF,6+ Mos 10/22/2016, 10/30/2018  . MMR 07/06/2017, 08/03/2017  . Tdap 06/13/2008, 07/20/2018  4. Cervical cancer screening- last seen by GYN in the last 6 months-last Pap smear.   March 2019 with 3-year repeat but HPV not detected so could stretch to 5 years.Follows with Dr. Talbert Nan 5. Breast cancer screening-  breast exam ever three  months and mammogram up to date.  Last mammogram June 2020-continue annual surveillance  6. Colon cancer screening - due 07/28/20.  Last colonoscopy June 2012 with no abnormalities per obstruction 7. Skin cancer screening- has been followed by dermatology in past year. advised regular sunscreen use. Denies worrisome, changing, or new skin lesions- does have some areas on her shin that wonders if related to shaving- has had since the summer- dermatology saw and did not recommend further work up  8. Birth control/STD check- n/a-monogamous and postmenopausal. 9. Osteoporosis screening at 64-never had-offered today-   Already takes vitamin D .  - Former smoker over 40 yrs ago.   Status of chronic or acute concerns   Hyperlipidemia, unspecified hyperlipidemia type - Plan: CBC with Differential/Platelet, Comprehensive metabolic panel, Lipid panel -Update lipid panel today and calculate ASCVD risk-only 2% based off last year's numbers so would be below threshold for treating with cholesterol medication of 7.5%   Musculoskeletal issues-some tension in neck and low back last year- chiropractic was helpful at that time- closed with Covid so that has been harder- doing about the same without it.  Also had tennis elbow last year-Voltaren gel was helpful- has not flared up but not using   Some heat intolerance-offered TSH last year but declined-no worsening since that time- will check TSH just due to lipids   Recommended follow up: Return in about 1 year (around 01/10/2020) for physical or sooner if needed. Future Appointments  Date Time Provider [place]  07/28/2019  8:30 AM Salvadore Dom, MD [place] None   Lab/Order associations: Not fasting   ICD-10-CM   1. Preventative health care  Z00.00 CBC with Differential/Platelet    Comprehensive metabolic panel    Lipid panel    TSH  2. Hyperlipidemia, unspecified hyperlipidemia type  E78.5 CBC with Differential/Platelet     Comprehensive metabolic panel    Lipid panel    TSH   Return precautions advised.  Garret Reddish, MD

## 2019-01-11 LAB — COMPREHENSIVE METABOLIC PANEL
ALT: 26 U/L (ref 0–35)
AST: 27 U/L (ref 0–37)
Albumin: 4.3 g/dL (ref 3.5–5.2)
Alkaline Phosphatase: 97 U/L (ref 39–117)
BUN: 18 mg/dL (ref 6–23)
CO2: 28 mEq/L (ref 19–32)
Calcium: 10 mg/dL (ref 8.4–10.5)
Chloride: 101 mEq/L (ref 96–112)
Creatinine, Ser: 0.75 mg/dL (ref 0.40–1.20)
GFR: 79.03 mL/min (ref >60.00)
Glucose, Bld: 82 mg/dL (ref 70–99)
Potassium: 5.2 mEq/L — ABNORMAL HIGH (ref 3.5–5.1)
Sodium: 136 mEq/L (ref 135–145)
Total Bilirubin: 0.5 mg/dL (ref 0.2–1.2)
Total Protein: 7.7 g/dL (ref 6.0–8.3)

## 2019-01-11 LAB — CBC WITH DIFFERENTIAL/PLATELET
Basophils Absolute: 0.1 10*3/uL (ref 0.0–0.1)
Basophils Relative: 0.7 % (ref 0.0–3.0)
Eosinophils Absolute: 0.1 10*3/uL (ref 0.0–0.7)
Eosinophils Relative: 1.7 % (ref 0.0–5.0)
HCT: 42 % (ref 36.0–46.0)
Hemoglobin: 14 g/dL (ref 12.0–15.0)
Lymphocytes Relative: 20.1 % (ref 12.0–46.0)
Lymphs Abs: 1.6 10*3/uL (ref 0.7–4.0)
MCHC: 33.4 g/dL (ref 30.0–36.0)
MCV: 87.1 fl (ref 78.0–100.0)
Monocytes Absolute: 0.6 10*3/uL (ref 0.1–1.0)
Monocytes Relative: 8.1 % (ref 3.0–12.0)
Neutro Abs: 5.5 10*3/uL (ref 1.4–7.7)
Neutrophils Relative %: 69.4 % (ref 43.0–77.0)
Platelets: 267 10*3/uL (ref 150.0–400.0)
RBC: 4.82 Mil/uL (ref 3.87–5.11)
RDW: 13.1 % (ref 11.5–15.5)
WBC: 7.9 10*3/uL (ref 4.0–10.5)

## 2019-01-11 LAB — LIPID PANEL
Cholesterol: 268 mg/dL — ABNORMAL HIGH (ref 0–200)
HDL: 73.8 mg/dL (ref >39.00)
LDL Cholesterol: 167 mg/dL — ABNORMAL HIGH (ref 0–99)
NonHDL: 193.75
Total CHOL/HDL Ratio: 4
Triglycerides: 134 mg/dL (ref 0.0–149.0)
VLDL: 26.8 mg/dL (ref 0.0–40.0)

## 2019-01-11 LAB — TSH: TSH: 5 u[IU]/mL — ABNORMAL HIGH (ref 0.35–4.50)

## 2019-01-12 ENCOUNTER — Other Ambulatory Visit: Payer: Self-pay

## 2019-01-12 DIAGNOSIS — E875 Hyperkalemia: Secondary | ICD-10-CM

## 2019-01-24 ENCOUNTER — Other Ambulatory Visit (INDEPENDENT_AMBULATORY_CARE_PROVIDER_SITE_OTHER): Payer: Managed Care, Other (non HMO)

## 2019-01-24 ENCOUNTER — Other Ambulatory Visit: Payer: Self-pay

## 2019-01-24 DIAGNOSIS — E875 Hyperkalemia: Secondary | ICD-10-CM

## 2019-01-25 LAB — BASIC METABOLIC PANEL
BUN: 16 mg/dL (ref 6–23)
CO2: 28 mEq/L (ref 19–32)
Calcium: 9.5 mg/dL (ref 8.4–10.5)
Chloride: 104 mEq/L (ref 96–112)
Creatinine, Ser: 0.79 mg/dL (ref 0.40–1.20)
GFR: 74.42 mL/min (ref >60.00)
Glucose, Bld: 91 mg/dL (ref 70–99)
Potassium: 4.7 mEq/L (ref 3.5–5.1)
Sodium: 137 mEq/L (ref 135–145)

## 2019-07-26 NOTE — Progress Notes (Signed)
59 y.o. G36P2002 Married White or Caucasian Not Hispanic or Latino female here for annual exam.  She was started on vaginal estrogen last year, didn't use a huge difference, but didn't use it consistently. She would like to try it again. She does have some entry dyspareunia, not using a lubricant.  H/O GSI, unchanged and tolerable. Varies, leaks a little bit every 1-2 days. Only notices it if she has a big sneeze. Knows how to do Kegels.     No LMP recorded (lmp unknown). Patient is postmenopausal.          Sexually active: Yes.    The current method of family planning is post menopausal status.    Exercising: Yes.    yoga, walking, and swimming Smoker:  no  Health Maintenance: Pap:  04/22/2017 WNL NEG HPV History of abnormal Pap:  no MMG:  08/17/18 density C Bi-rads 1 neg  BMD:   PCP normal in [place]  Colonoscopy: 07/29/10 WNL TDaP:  07/20/18  Gardasil:N/A   reports that she quit smoking about 30 years ago. Her smoking use included cigarettes. She has a 2.50 pack-year smoking history. She has never used smokeless tobacco. She reports current alcohol use of about 7.0 standard drinks of alcohol per week. She reports that she does not use drugs. She runs a Emergency planning/management officer. Daughter is working in [place], Son has one more year of college in [place].   Past Medical History:  Diagnosis Date  . Hyperlipidemia     Past Surgical History:  Procedure Laterality Date  . CYST EXCISION     benign- right upper arm  . WISDOM TOOTH EXTRACTION      Current Outpatient Medications  Medication Sig Dispense Refill  . Estradiol 10 MCG TABS vaginal tablet Place one tablet vaginally qhs x 1 week, then 2 x a week at hs 24 tablet 3   No current facility-administered medications for this visit.    Family History  Problem Relation Age of Onset  . Brain cancer Mother        glioblastoma  . Hyperlipidemia Mother   . Heart disease Father        8. cigar smoker.   . Lung cancer Sister   . Multiple myeloma Sister   .  Heart disease Brother        44s. heavy smoker.   . Depression Daughter   . Healthy Sister   . Breast cancer Maternal Aunt   MA in her 15's with breast cancer diagnosis.   Review of Systems  Constitutional: Negative.   HENT: Negative.   Eyes: Negative.   Respiratory: Negative.   Cardiovascular: Negative.   Gastrointestinal: Negative.   Endocrine: Negative.   Genitourinary: Negative.   Skin: Negative.   Allergic/Immunologic: Negative.   Neurological: Negative.   Hematological: Negative.   Psychiatric/Behavioral: Positive for dysphoric mood and sleep disturbance. The patient is nervous/anxious.     Exam:   BP 120/60   Pulse 73   Temp 98.8 F (37.1 C)   Ht '5\' 10"'$  (1.778 m)   Wt 166 lb (75.3 kg)   LMP  (LMP Unknown)   SpO2 97%   BMI 23.82 kg/m   Weight change: '@WEIGHTCHANGE'$ @ Height:   Height: '5\' 10"'$  (177.8 cm)  Ht Readings from Last 3 Encounters:  07/28/19 '5\' 10"'$  (1.778 m)  01/10/19 '5\' 10"'$  (1.778 m)  07/20/18 '5\' 10"'$  (1.778 m)    General appearance: alert, cooperative and appears stated age Head: Normocephalic, without obvious abnormality, atraumatic Neck: no  adenopathy, supple, symmetrical, trachea midline and thyroid normal to inspection and palpation Lungs: clear to auscultation bilaterally Cardiovascular: regular rate and rhythm Breasts: normal appearance, no masses or tenderness Abdomen: soft, non-tender; non distended,  no masses,  no organomegaly Extremities: extremities normal, atraumatic, no cyanosis or edema Skin: Skin color, texture, turgor normal. No rashes or lesions Lymph nodes: Cervical, supraclavicular, and axillary nodes normal. No abnormal inguinal nodes palpated Neurologic: Grossly normal   Pelvic: External genitalia:  no lesions              Urethra:  normal appearing urethra with no masses, tenderness or lesions              Bartholins and Skenes: normal                 Vagina: normal appearing vagina with normal color and discharge, no  lesions              Cervix: no lesions               Bimanual Exam:  Uterus:  normal size, contour, position, consistency, mobility, non-tender              Adnexa: no mass, fullness, tenderness               Rectovaginal: Confirms               Anus:  normal sphincter tone, no lesions  Gae Dry chaperoned for the exam.  A:  Well Woman with normal exam  Vaginal atrophy  GSI is tolerable  P:   No pap this year  Discussed breast self exam  Discussed calcium and vit D intake  Labs with primary  Mammogram due this month  Colonoscopy UTD  Restart vaginal estrogen

## 2019-07-27 ENCOUNTER — Other Ambulatory Visit: Payer: Self-pay

## 2019-07-28 ENCOUNTER — Telehealth: Payer: Self-pay

## 2019-07-28 ENCOUNTER — Other Ambulatory Visit: Payer: Self-pay | Admitting: Obstetrics and Gynecology

## 2019-07-28 ENCOUNTER — Encounter: Payer: Self-pay | Admitting: Obstetrics and Gynecology

## 2019-07-28 ENCOUNTER — Ambulatory Visit: Payer: Managed Care, Other (non HMO) | Admitting: Obstetrics and Gynecology

## 2019-07-28 VITALS — BP 120/60 | HR 73 | Temp 98.8°F | Ht 70.0 in | Wt 166.0 lb

## 2019-07-28 DIAGNOSIS — Z01419 Encounter for gynecological examination (general) (routine) without abnormal findings: Secondary | ICD-10-CM

## 2019-07-28 DIAGNOSIS — N952 Postmenopausal atrophic vaginitis: Secondary | ICD-10-CM | POA: Diagnosis not present

## 2019-07-28 DIAGNOSIS — N393 Stress incontinence (female) (male): Secondary | ICD-10-CM | POA: Diagnosis not present

## 2019-07-28 DIAGNOSIS — Z1231 Encounter for screening mammogram for malignant neoplasm of breast: Secondary | ICD-10-CM

## 2019-07-28 MED ORDER — ESTRADIOL 10 MCG VA TABS: ORAL_TABLET | VAGINAL | 3 refills | Status: DC

## 2019-07-28 NOTE — Telephone Encounter (Signed)
Pt was seen today. Spoke with pt. Pt wanting to know about estradiol tablets Rx. Pt advised was sent electronically. Pt verbalized understanding.  Pt given number to TBC to schedule her screening MMG. Pt agreeable.  Encounter closed.

## 2019-07-28 NOTE — Patient Instructions (Signed)

## 2019-07-28 NOTE — Telephone Encounter (Signed)
Patient called regarding questions about a  prescription. Patient also stated she would like the number to the center for getting a mammogram.

## 2019-08-24 ENCOUNTER — Ambulatory Visit
Admission: RE | Admit: 2019-08-24 | Discharge: 2019-08-24 | Disposition: A | Discharge status: Home / Self Care | Payer: Managed Care, Other (non HMO) | Source: Ambulatory Visit | Attending: Obstetrics and Gynecology | Admitting: Obstetrics and Gynecology

## 2019-08-24 ENCOUNTER — Other Ambulatory Visit: Payer: Self-pay

## 2019-08-24 DIAGNOSIS — Z1231 Encounter for screening mammogram for malignant neoplasm of breast: Secondary | ICD-10-CM

## 2020-01-16 ENCOUNTER — Other Ambulatory Visit: Payer: Self-pay

## 2020-01-16 ENCOUNTER — Telehealth: Payer: Self-pay

## 2020-01-16 ENCOUNTER — Ambulatory Visit (INDEPENDENT_AMBULATORY_CARE_PROVIDER_SITE_OTHER): Payer: Managed Care, Other (non HMO) | Admitting: Family Medicine

## 2020-01-16 ENCOUNTER — Encounter: Payer: Self-pay | Admitting: Family Medicine

## 2020-01-16 VITALS — BP 122/70 | HR 65 | Temp 97.3°F | Resp 18 | Ht 70.0 in | Wt 167.4 lb

## 2020-01-16 DIAGNOSIS — R7989 Other specified abnormal findings of blood chemistry: Secondary | ICD-10-CM | POA: Diagnosis not present

## 2020-01-16 DIAGNOSIS — Z0000 Encounter for general adult medical examination without abnormal findings: Secondary | ICD-10-CM

## 2020-01-16 DIAGNOSIS — E785 Hyperlipidemia, unspecified: Secondary | ICD-10-CM

## 2020-01-16 NOTE — Telephone Encounter (Signed)
Patient would like to know if she can have black tea while she is fasting for lab.   Please advise.

## 2020-01-16 NOTE — Patient Instructions (Addendum)
Please call [phone] to schedule a visit with [facility] -Trey Paula is an excellent counselor who is based out of our clinic  Send [place] all 3 dates for covid so we can verify  Schedule a lab visit at the check out desk within 2 weeks. Return for future fasting labs meaning nothing but water after midnight please. Ok to take your medications with water.   Recommended follow up: Return in about 1 year (around 01/15/2021) for physical or sooner if needed.

## 2020-01-16 NOTE — Progress Notes (Signed)
Phone [phone]   Subjective:  Patient presents today for their annual physical. Chief complaint-noted.   See problem oriented charting- ROS- full  review of systems was completed and negative except for: urinary frequency, feels stretched/stressed  The following were reviewed and entered/updated in epic: Past Medical History:  Diagnosis Date  . Hyperlipidemia    Patient Active Problem List   Diagnosis Date Noted  . Hyperlipidemia, unspecified 06/24/2017  . Lateral epicondylitis of right elbow 03/09/2017   Past Surgical History:  Procedure Laterality Date  . CYST EXCISION     benign- right upper arm  . WISDOM TOOTH EXTRACTION      Family History  Problem Relation Age of Onset  . Brain cancer Mother        glioblastoma  . Hyperlipidemia Mother   . Heart disease Father        15. cigar smoker.   . Lung cancer Sister   . Multiple myeloma Sister   . Heart disease Brother        44s. heavy smoker.   . Depression Daughter   . Healthy Sister   . Breast cancer Maternal Aunt     Medications- reviewed and updated Current Outpatient Medications  Medication Sig Dispense Refill  . Estradiol 10 MCG TABS vaginal tablet Place one tablet vaginally qhs x 1 week, then 2 x a week at hs (Patient not taking: Reported on 01/16/2020) 24 tablet 3   No current facility-administered medications for this visit.    Allergies-reviewed and updated No Known Allergies  Social History   Social History Narrative   Married.  Son 53 and daughter 32 both in [place]. Son in business school. Daughter getting masters in [place]   Just moved here from [place] with husband.       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 TM normal. Bridge of nose normal. Eyelids normal.  Neck: no thyromegaly or cervical lymphadenopathy  CV: RRR no murmurs rubs or gallops Lungs: CTAB no crackles, wheeze, rhonchi Abdomen: soft/nontender/nondistended/normal bowel sounds. No rebound or guarding.  Ext: no edema Skin: warm, dry Neuro: grossly normal, moves all extremities, PERRLA   Assessment and Plan   59 y.o. female presenting for annual physical.  Health Maintenance counseling: 1. Anticipatory guidance: Patient counseled regarding regular dental exams q6 months, eye exams- yearly,  avoiding smoking and second hand smoke, limiting alcohol to 1 beverage per day.   2. Risk factor reduction:  Advised patient of need for regular exercise and diet rich and fruits and vegetables to reduce risk of heart attack and stroke. Exercise- pilates (strenuous weekly)/yoga/walking (daily). Diet-reaosnably healthy diet.  Wt Readings from Last 3 Encounters:  01/16/20 167 lb 6.4 oz (75.9 kg)  07/28/19 166 lb (75.3 kg)  01/10/19 163 lb 12.8 oz (74.3 kg)  3. Immunizations/screenings/ancillary studies- will call back with. She will send all 3 dates.  Immunization History  Administered Date(s) Administered  . Influenza,inj,Quad PF,6+ Mos 10/22/2016, 10/30/2018  . Influenza-Unspecified 01/02/2020  . MMR 07/06/2017, 08/03/2017  . PFIZER SARS-COV-2 Vaccination 01/02/2020  . Tdap 06/13/2008, 07/20/2018  4. Cervical  cancer screening- march 2019 and hpv negative . Will make joint decision with GYN whether to repeat at 3 or 5 years.  5. Breast cancer screening-  breast exam self exams every months and gyn yearly and mammogram 08/24/19 with 3d reassuring 6. Colon cancer screening - 07/29/2010 so will e due next june 7. Skin cancer screening- sees dermatology yearly. advised regular sunscreen use. Denies worrisome, changing, or new skin lesions.  8. Birth control/STD check- postmenopausal and monogamous 9. Osteoporosis screening at  58- will pla on at 52, no family history -Former smoker- quit in 1990s and under 3 pack years  Status of chronic or acute concerns   # urinary frequency- slightly worse lately in last year- will check urinalysis when she comes back  #hyperlipidemia S: Medication:none   Lab Results  Component Value Date   CHOL 268 (H) 01/10/2019   HDL 73.80 01/10/2019   LDLCALC 167 (H) 01/10/2019   TRIG 134.0 01/10/2019   CHOLHDL 4 01/10/2019   A/P: hopefully stable or improved- update lipid panel when she comes back for labs. Last 10 year ascvd risk only 3.1% below statin range.   #mildly elevated tsh in past- will check again with labs  #stress management- had a life coach in the past. Discussed therapist.   # slightly elevated TSH in past- will update with labs  Recommended follow up: yearly for physical Future Appointments  Date Time Provider [place]  08/01/2020  9:00 AM Salvadore Dom, MD [place] None   Lab/Order associations:non fasting   ICD-10-CM   1. Preventative health care  T27.63 COMPLETE METABOLIC PANEL WITH GFR    CBC With Differential/Platelet    Lipid Panel w/reflex Direct LDL    TSH    POCT Urinalysis Dipstick (Automated)  2. Hyperlipidemia, unspecified hyperlipidemia type  R43.2 COMPLETE METABOLIC PANEL WITH GFR    CBC With Differential/Platelet    Lipid Panel w/reflex Direct LDL    TSH    POCT Urinalysis Dipstick (Automated)  3. Elevated TSH  R79.89 TSH    No orders of the defined types were placed in this encounter.   Return precautions advised.  Garret Reddish, MD

## 2020-01-17 DIAGNOSIS — Z20822 Contact with and (suspected) exposure to covid-19: Secondary | ICD-10-CM | POA: Diagnosis not present

## 2020-01-17 NOTE — Telephone Encounter (Signed)
See below

## 2020-01-17 NOTE — Telephone Encounter (Signed)
I dont think it would significantly throw off results- im ok if that's really important to her to have before coming in.

## 2020-01-17 NOTE — Telephone Encounter (Signed)
Called and made pt aware.

## 2020-01-30 ENCOUNTER — Other Ambulatory Visit (INDEPENDENT_AMBULATORY_CARE_PROVIDER_SITE_OTHER): Payer: BC Managed Care – PPO

## 2020-01-30 ENCOUNTER — Other Ambulatory Visit: Payer: Self-pay

## 2020-01-30 DIAGNOSIS — E785 Hyperlipidemia, unspecified: Secondary | ICD-10-CM | POA: Diagnosis not present

## 2020-01-30 DIAGNOSIS — Z0000 Encounter for general adult medical examination without abnormal findings: Secondary | ICD-10-CM | POA: Diagnosis not present

## 2020-01-30 DIAGNOSIS — R7989 Other specified abnormal findings of blood chemistry: Secondary | ICD-10-CM

## 2020-01-30 LAB — POCT URINALYSIS DIPSTICK
Bilirubin, UA: NEGATIVE
Blood, UA: NEGATIVE
Glucose, UA: NEGATIVE
Ketones, UA: NEGATIVE
Nitrite, UA: NEGATIVE
Protein, UA: NEGATIVE
Spec Grav, UA: 1.015 (ref 1.010–1.025)
Urobilinogen, UA: 0.2 E.U./dL
pH, UA: 6 (ref 5.0–8.0)

## 2020-01-31 LAB — CBC WITH DIFFERENTIAL/PLATELET
Absolute Monocytes: 598 cells/uL (ref 200–950)
Basophils Absolute: 72 cells/uL (ref 0–200)
Basophils Relative: 1.1 %
Eosinophils Absolute: 137 cells/uL (ref 15–500)
Eosinophils Relative: 2.1 %
HCT: 41.6 % (ref 35.0–45.0)
Hemoglobin: 13.5 g/dL (ref 11.7–15.5)
Lymphs Abs: 1476 cells/uL (ref 850–3900)
MCH: 28.1 pg (ref 27.0–33.0)
MCHC: 32.5 g/dL (ref 32.0–36.0)
MCV: 86.5 fL (ref 80.0–100.0)
MPV: 10.4 fL (ref 7.5–12.5)
Monocytes Relative: 9.2 %
Neutro Abs: 4219 cells/uL (ref 1500–7800)
Neutrophils Relative %: 64.9 %
Platelets: 298 10*3/uL (ref 140–400)
RBC: 4.81 10*6/uL (ref 3.80–5.10)
RDW: 12.5 % (ref 11.0–15.0)
Total Lymphocyte: 22.7 %
WBC: 6.5 10*3/uL (ref 3.8–10.8)

## 2020-01-31 LAB — LIPID PANEL W/REFLEX DIRECT LDL
Cholesterol: 245 mg/dL — ABNORMAL HIGH (ref <200)
HDL: 72 mg/dL (ref >=50)
LDL Cholesterol (Calc): 155 mg/dL (calc) — ABNORMAL HIGH
Non-HDL Cholesterol (Calc): 173 mg/dL (calc) — ABNORMAL HIGH (ref <130)
Total CHOL/HDL Ratio: 3.4 (calc) (ref <5.0)
Triglycerides: 75 mg/dL (ref <150)

## 2020-01-31 LAB — COMPLETE METABOLIC PANEL WITH GFR
AG Ratio: 1.7 (calc) (ref 1.0–2.5)
ALT: 32 U/L — ABNORMAL HIGH (ref 6–29)
AST: 29 U/L (ref 10–35)
Albumin: 4.3 g/dL (ref 3.6–5.1)
Alkaline phosphatase (APISO): 92 U/L (ref 37–153)
BUN: 17 mg/dL (ref 7–25)
CO2: 29 mmol/L (ref 20–32)
Calcium: 9.6 mg/dL (ref 8.6–10.4)
Chloride: 104 mmol/L (ref 98–110)
Creat: 0.79 mg/dL (ref 0.50–0.99)
GFR, Est African American: 94 mL/min/{1.73_m2} (ref >=60)
GFR, Est Non African American: 81 mL/min/{1.73_m2} (ref >=60)
Globulin: 2.6 g/dL (calc) (ref 1.9–3.7)
Glucose, Bld: 91 mg/dL (ref 65–99)
Potassium: 4.9 mmol/L (ref 3.5–5.3)
Sodium: 139 mmol/L (ref 135–146)
Total Bilirubin: 0.5 mg/dL (ref 0.2–1.2)
Total Protein: 6.9 g/dL (ref 6.1–8.1)

## 2020-01-31 LAB — TSH: TSH: 4.93 mIU/L — ABNORMAL HIGH (ref 0.40–4.50)

## 2020-02-06 ENCOUNTER — Telehealth: Payer: Self-pay

## 2020-02-06 NOTE — Telephone Encounter (Signed)
Patient states she has a possible yeast infection I explained to patient she would need apt but she expressed she had a urinalysis about a week ago and would like to use those results instead of having an appt.

## 2020-02-07 ENCOUNTER — Telehealth: Payer: Self-pay

## 2020-02-07 NOTE — Telephone Encounter (Signed)
Pt is aware of her lab results and the comments you left about her issue from today. She states that's he will try to come in the office today at about 4pm if she's unable to make it she will come first thing in the morning.

## 2020-02-07 NOTE — Telephone Encounter (Signed)
Itching usually not caused by UTI but she did have possible UTI  From last lab note (does not appear team addressed) " Urinalysis is largely normal.  There were some infection fighting cells in the urine called leukocytes.  Team see if the urine culture can be added onto urine test-if not please have her back for urine culture under polyuria and leukocytes in urine"  Can we have her by for a urine? I would be ok if you set her up in a room for self swab for yeast and BV (donna can likely show you how to do this both ordering and instructing patient if needed). When I have results back could determine treatment- both will take a few days I believe

## 2020-02-07 NOTE — Telephone Encounter (Signed)
This patient called in for some vaginal itching that she's having. She states that she would like a RX sent into the pharmacy for her she doesn't want to be seen because she was just in the office and she said that she did a UA.

## 2020-02-07 NOTE — Telephone Encounter (Signed)
Noted! Thank you

## 2020-02-07 NOTE — Telephone Encounter (Signed)
Spoke with patient today , did leave a note in her chart. Please advise.

## 2020-02-07 NOTE — Telephone Encounter (Signed)
Looks like there are 2 separate phone trees for the same message-please make sure everything from this message is covered by other phone thread about this-if possible in the future put all of these under 1 phone tree.  For instance you could simply state addressed in separate phone note and then send me any follow-up under that other phone note-if you need me to show you how to open other phone notes-I can do so

## 2020-02-07 NOTE — Telephone Encounter (Signed)
Lvm to call back.  Nurse Assessment Nurse: Sherrell Puller, RN, Amy Date/Time Eilene Ghazi Time): 02/06/2020 8:45:43 AM Confirm and document reason for call. If symptomatic, describe symptoms. ---Caller states she's been having vaginal itching, irritation and soreness in the last 24-48 hours. No vaginal discharge, no fevers. Does the patient have any new or worsening symptoms? ---Yes Will a triage be completed? ---Yes Related visit to physician within the last 2 weeks? ---No Does the PT have any chronic conditions? (i.e. diabetes, asthma, this includes High risk factors for pregnancy, etc.) ---No Is this a behavioral health or substance abuse call? ---No Guidelines Guideline Title Affirmed Question Affirmed Notes Nurse Date/Time (Eastern Time) Vaginal Symptoms MODERATE-SEVERE itching (i.e., interferes with school, work, or sleep) Sherrell Puller, RN, Amy 02/06/2020 8:47:32 AM Disp. Time Eilene Ghazi Time) Disposition Final User 02/06/2020 8:49:46 AM See PCP within 24 Hours Yes Sherrell Puller, RN, Amy Caller Disagree/Comply Comply Caller Understands Yes PLEASE NOTE: All timestamps contained within this report are represented as [place] Standard Time. CONFIDENTIALTY NOTICE: This fax transmission is intended only for the addressee. It contains information that is legally privileged, confidential or otherwise protected from use or disclosure. If you are not the intended recipient, you are strictly prohibited from reviewing, disclosing, copying using or disseminating any of this information or taking any action in reliance on or regarding this information. If you have received this fax in error, please notify us immediately by telephone so that we can arrange for its return to [place]. Phone: [phone], Toll-Free: [phone], Fax: [phone] Page: 2 of 2 Call Id: [id number] PreDisposition Did not know what to do Care Advice Given Per Guideline SEE PCP WITHIN 24 HOURS: * IF OFFICE WILL BE OPEN: You need to be examined  within the next 24 hours. Call your doctor (or NP/PA) when the office opens and make an appointment. CLEANSING: * Wash the area once thoroughly with un-scented soap and water to remove any irritants. ANTIHISTAMINE MEDICINES FOR SEVERE ITCHING: * For severe itching, take either cetirizine or loratadine. * They are over-the-counter (OTC) antihistamine medicines. You can buy them at the drugstore. DO NOT: * DO NOT USE ANY VAGINAL CREAMS during the 24 hours before your physician appointment (Reason: interferes with examination). * DO NOT DOUCHE (Reason: douching does not help vaginitis, and may actually make it worse). CALL BACK IF: * Fever or severe pain * You become worse CARE ADVICE given per Vaginal Symptoms (Adult) guideline. Referrals REFERRED TO PCP OFFICE

## 2020-02-08 ENCOUNTER — Other Ambulatory Visit: Payer: Self-pay

## 2020-02-08 ENCOUNTER — Other Ambulatory Visit: Payer: BC Managed Care – PPO

## 2020-02-08 ENCOUNTER — Other Ambulatory Visit (HOSPITAL_COMMUNITY)
Admission: RE | Admit: 2020-02-08 | Discharge: 2020-02-08 | Disposition: A | Discharge status: Home / Self Care | Payer: BC Managed Care – PPO | Source: Ambulatory Visit | Attending: Family Medicine | Admitting: Family Medicine

## 2020-02-08 DIAGNOSIS — N898 Other specified noninflammatory disorders of vagina: Secondary | ICD-10-CM | POA: Diagnosis not present

## 2020-02-08 DIAGNOSIS — R82998 Other abnormal findings in urine: Secondary | ICD-10-CM | POA: Diagnosis not present

## 2020-02-08 DIAGNOSIS — R399 Unspecified symptoms and signs involving the genitourinary system: Secondary | ICD-10-CM

## 2020-02-08 DIAGNOSIS — R3589 Other polyuria: Secondary | ICD-10-CM

## 2020-02-09 ENCOUNTER — Other Ambulatory Visit: Payer: Self-pay | Admitting: Family Medicine

## 2020-02-09 LAB — URINE CULTURE
MICRO NUMBER:: 11348330
SPECIMEN QUALITY:: ADEQUATE

## 2020-02-09 LAB — CERVICOVAGINAL ANCILLARY ONLY
Bacterial Vaginitis (gardnerella): POSITIVE — AB
Candida Glabrata: NEGATIVE
Candida Vaginitis: NEGATIVE
Comment: NEGATIVE
Comment: NEGATIVE
Comment: NEGATIVE

## 2020-02-09 MED ORDER — METRONIDAZOLE 500 MG PO TABS: 500.0000 mg | ORAL_TABLET | Freq: Two times a day (BID) | ORAL | 0 refills | Status: DC

## 2020-03-02 ENCOUNTER — Encounter: Payer: Self-pay | Admitting: Family Medicine

## 2020-03-05 NOTE — Progress Notes (Signed)
  I was 30 minutes late for appointment and patient had an already scheduled separate appointment-since she was feeling better we opted to cancel visit-no charge should be applied

## 2020-03-05 NOTE — Patient Instructions (Signed)
Health Maintenance Due  Topic Date Due  . COVID-19 Vaccine (2 - Pfizer 3-dose series) 01/23/2020  . PAP SMEAR-Modifier  04/22/2020   Depression screen [facility] 2/9 01/16/2020 01/10/2019 05/01/2017  Decreased Interest 0 0 0  Down, Depressed, Hopeless 0 0 0  PHQ - 2 Score 0 0 0  Altered sleeping - 1 -  Tired, decreased energy - 0 -  Change in appetite - 0 -  Feeling bad or failure about yourself  - 0 -  Trouble concentrating - 0 -  Moving slowly or fidgety/restless - 0 -  Suicidal thoughts - 0 -  PHQ-9 Score - 1 -  Difficult doing work/chores - Not difficult at all -

## 2020-03-06 ENCOUNTER — Telehealth: Payer: BC Managed Care – PPO | Admitting: Family Medicine

## 2020-03-06 ENCOUNTER — Encounter: Payer: Self-pay | Admitting: Family Medicine

## 2020-03-20 DIAGNOSIS — Z20822 Contact with and (suspected) exposure to covid-19: Secondary | ICD-10-CM | POA: Diagnosis not present

## 2020-03-20 DIAGNOSIS — Z03818 Encounter for observation for suspected exposure to other biological agents ruled out: Secondary | ICD-10-CM | POA: Diagnosis not present

## 2020-04-27 ENCOUNTER — Encounter: Payer: Self-pay | Admitting: Family Medicine

## 2020-05-30 ENCOUNTER — Ambulatory Visit: Payer: BC Managed Care – PPO | Admitting: Physician Assistant

## 2020-07-02 ENCOUNTER — Encounter: Payer: Self-pay | Admitting: Family Medicine

## 2020-07-23 ENCOUNTER — Other Ambulatory Visit: Payer: Self-pay | Admitting: Obstetrics and Gynecology

## 2020-07-23 DIAGNOSIS — Z1231 Encounter for screening mammogram for malignant neoplasm of breast: Secondary | ICD-10-CM

## 2020-08-01 ENCOUNTER — Ambulatory Visit: Payer: Managed Care, Other (non HMO) | Admitting: Obstetrics and Gynecology

## 2020-08-07 DIAGNOSIS — H31001 Unspecified chorioretinal scars, right eye: Secondary | ICD-10-CM | POA: Diagnosis not present

## 2020-08-21 ENCOUNTER — Other Ambulatory Visit: Payer: Self-pay

## 2020-08-21 ENCOUNTER — Ambulatory Visit (INDEPENDENT_AMBULATORY_CARE_PROVIDER_SITE_OTHER): Payer: BC Managed Care – PPO | Admitting: Obstetrics and Gynecology

## 2020-08-21 ENCOUNTER — Encounter: Payer: Self-pay | Admitting: Obstetrics and Gynecology

## 2020-08-21 VITALS — BP 122/64 | HR 73 | Ht 69.5 in | Wt 163.0 lb

## 2020-08-21 DIAGNOSIS — Z1211 Encounter for screening for malignant neoplasm of colon: Secondary | ICD-10-CM

## 2020-08-21 DIAGNOSIS — N952 Postmenopausal atrophic vaginitis: Secondary | ICD-10-CM | POA: Diagnosis not present

## 2020-08-21 DIAGNOSIS — Z01419 Encounter for gynecological examination (general) (routine) without abnormal findings: Secondary | ICD-10-CM | POA: Diagnosis not present

## 2020-08-21 MED ORDER — ESTRADIOL 10 MCG VA TABS: ORAL_TABLET | VAGINAL | 3 refills | Status: AC

## 2020-08-21 NOTE — Progress Notes (Signed)
60 y.o. G90P2002 Married White or Caucasian Not Hispanic or Latino female here for annual exam.  Her 36 husband passed away at the end of 04-03-2022 of sudden cardiac arrest. They have been together since she was 52. She has good support and is speaking with a grief counselor. She is sleeping okay.   No vaginal bleeding  H/O GSI, stable and tolerable. No bowel c/o.     No LMP recorded (lmp unknown). Patient is postmenopausal.          Sexually active: No.  The current method of family planning is post menopausal status.    Exercising: Yes.     Swimming walking yoga walking  Smoker:  no  Health Maintenance: Pap:  04/22/17 WNL Hr HPV Neg  History of abnormal Pap:  no MMG:  08/24/19 density C Birads 1 Neg 08/17/18 density C Bi-rads 1 neg  Colonoscopy: 07/29/10 F/u 10 years  TDaP:  07/20/2018 Gardasil: NA   reports that she quit smoking about 31 years ago. Her smoking use included cigarettes. She has a 2.50 pack-year smoking history. She has never used smokeless tobacco. She reports current alcohol use of about 7.0 standard drinks of alcohol per week. She reports that she does not use drugs. 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 Daughter is working in [place], Son has one more year of school in [place] (undergraduate).   Past Medical History:  Diagnosis Date   Hyperlipidemia     Past Surgical History:  Procedure Laterality Date   CYST EXCISION     benign- right upper arm   WISDOM TOOTH EXTRACTION      Current Outpatient Medications  Medication Sig Dispense Refill   Estradiol 10 MCG TABS vaginal tablet Place one tablet vaginally qhs x 1 week, then 2 x a week at hs 24 tablet 3   No current facility-administered medications for this visit.    Family History  Problem Relation Age of Onset   Brain cancer Mother        glioblastoma   Hyperlipidemia Mother    Heart disease Father        38. cigar smoker.    Lung cancer Sister    Multiple myeloma Sister     Heart disease Brother        98s. heavy smoker.    Depression Daughter    Healthy Sister    Breast cancer Maternal Aunt     Review of Systems  All other systems reviewed and are negative.  Exam:   BP 122/64   Pulse 73   Ht 5' 9.5" (1.765 m)   Wt 163 lb (73.9 kg)   LMP  (LMP Unknown)   SpO2 99%   BMI 23.73 kg/m   Weight change: $RemoveBefore'@WEIGHTCHANGE'rzcJPzbNKqzDR$ @ Height:   Height: 5' 9.5" (176.5 cm)  Ht Readings from Last 3 Encounters:  08/21/20 5' 9.5" (1.765 m)  01/16/20 $RemoveB'5\' 10"'byOfQHKl$  (1.778 m)  07/28/19 $RemoveB'5\' 10"'fVXLiZPL$  (1.778 m)    General appearance: alert, cooperative and appears stated age Head: Normocephalic, without obvious abnormality, atraumatic Neck: no adenopathy, supple, symmetrical, trachea midline and thyroid normal to inspection and palpation Lungs: clear to auscultation bilaterally Cardiovascular: regular rate and rhythm Breasts: normal appearance, no masses or tenderness Abdomen: soft, non-tender; non distended,  no masses,  no organomegaly Extremities: extremities normal, atraumatic, no cyanosis or edema Skin: Skin color, texture, turgor normal. No rashes or lesions Lymph nodes: Cervical, supraclavicular, and axillary nodes normal. No abnormal inguinal  nodes palpated Neurologic: Grossly normal   Pelvic: External genitalia:  no lesions              Urethra:  normal appearing urethra with no masses, tenderness or lesions              Bartholins and Skenes: normal                 Vagina: atrophic appearing vagina with normal color and discharge, no lesions              Cervix: no lesions               Bimanual Exam:  Uterus:  normal size, contour, position, consistency, mobility, non-tender              Adnexa: no mass, fullness, tenderness               Rectovaginal: Confirms               Anus:  normal sphincter tone, no lesions  Gae Dry chaperoned for the exam.  1. Well woman exam Discussed breast self exam Discussed calcium and vit D intake No pap this year Mammogram is  scheduled Labs with primary  2. Colon cancer screening - Ambulatory referral to Gastroenterology  3. Vaginal atrophy She wanted a written script, not sure if she will fill it.  - Estradiol 10 MCG TABS vaginal tablet; Place one tablet vaginally qhs x 1 week, then 2 x a week at hs  Dispense: 24 tablet; Refill: 3

## 2020-08-21 NOTE — Patient Instructions (Signed)

## 2020-09-14 ENCOUNTER — Ambulatory Visit: Payer: Self-pay

## 2020-09-19 ENCOUNTER — Encounter: Payer: Self-pay | Admitting: Internal Medicine

## 2020-10-06 ENCOUNTER — Other Ambulatory Visit: Payer: Self-pay

## 2020-10-06 ENCOUNTER — Ambulatory Visit
Admission: RE | Admit: 2020-10-06 | Discharge: 2020-10-06 | Disposition: A | Discharge status: Home / Self Care | Payer: Self-pay | Source: Ambulatory Visit | Attending: Obstetrics and Gynecology | Admitting: Obstetrics and Gynecology

## 2020-10-06 DIAGNOSIS — Z1231 Encounter for screening mammogram for malignant neoplasm of breast: Secondary | ICD-10-CM

## 2020-11-19 NOTE — Progress Notes (Unsigned)
Patient no showed PV today- Called patient and left message to return call by 5 pm today- If no call by 5 pm, PV and procedure will be canceled - no call at 5 pm -  PV and Procedure both canceled- No Show letter mailed to patient  

## 2020-11-20 ENCOUNTER — Other Ambulatory Visit: Payer: Self-pay

## 2020-11-20 ENCOUNTER — Ambulatory Visit (AMBULATORY_SURGERY_CENTER): Payer: BC Managed Care – PPO | Admitting: *Deleted

## 2020-11-20 VITALS — Ht 70.0 in | Wt 155.0 lb

## 2020-11-20 DIAGNOSIS — Z1211 Encounter for screening for malignant neoplasm of colon: Secondary | ICD-10-CM

## 2020-11-20 NOTE — Progress Notes (Signed)
No egg or soy allergy known to patient  No issues known to pt with past sedation with any surgeries or procedures Patient denies ever being told they had issues or difficulty with intubation  No FH of Malignant Hyperthermia Pt is not on diet pills Pt is not on  home 02  Pt is not on blood thinners  Pt denies issues with constipation  No A fib or A flutter  EMMI video to pt or via MyChart   Pt is fully vaccinated  for Covid   Due to the COVID-19 pandemic we are asking patients to follow certain guidelines.  Pt aware of COVID protocols and [facility] guidelines  Pt verified name, DOB, address and insurance during PV today.  Pt mailed instruction packet of Emmi video, copy of consent form to read and not return, and instructions.  PV completed over the phone.  Pt encouraged to call with questions or issues.  My Chart instructions to pt as well

## 2020-11-22 DIAGNOSIS — D2272 Melanocytic nevi of left lower limb, including hip: Secondary | ICD-10-CM | POA: Diagnosis not present

## 2020-11-22 DIAGNOSIS — D2271 Melanocytic nevi of right lower limb, including hip: Secondary | ICD-10-CM | POA: Diagnosis not present

## 2020-11-22 DIAGNOSIS — D224 Melanocytic nevi of scalp and neck: Secondary | ICD-10-CM | POA: Diagnosis not present

## 2020-11-22 DIAGNOSIS — D225 Melanocytic nevi of trunk: Secondary | ICD-10-CM | POA: Diagnosis not present

## 2020-11-26 ENCOUNTER — Encounter: Payer: Self-pay | Admitting: Internal Medicine

## 2020-12-04 ENCOUNTER — Encounter: Payer: Self-pay | Admitting: Internal Medicine

## 2020-12-04 ENCOUNTER — Ambulatory Visit (AMBULATORY_SURGERY_CENTER): Payer: BC Managed Care – PPO | Admitting: Internal Medicine

## 2020-12-04 VITALS — BP 121/69 | HR 51 | Temp 97.7°F | Resp 20 | Ht 69.5 in | Wt 155.0 lb

## 2020-12-04 DIAGNOSIS — K635 Polyp of colon: Secondary | ICD-10-CM | POA: Diagnosis not present

## 2020-12-04 DIAGNOSIS — K621 Rectal polyp: Secondary | ICD-10-CM

## 2020-12-04 DIAGNOSIS — D128 Benign neoplasm of rectum: Secondary | ICD-10-CM

## 2020-12-04 DIAGNOSIS — Z1211 Encounter for screening for malignant neoplasm of colon: Secondary | ICD-10-CM

## 2020-12-04 DIAGNOSIS — D122 Benign neoplasm of ascending colon: Secondary | ICD-10-CM

## 2020-12-04 MED ORDER — SODIUM CHLORIDE 0.9 % IV SOLN: 500.0000 mL | Freq: Once | INTRAVENOUS | Status: DC

## 2020-12-04 NOTE — Op Note (Signed)
[facility] Patient Name: Christine Medina Procedure Date: 12/04/2020 10:22 AM MRN: [id number] Endoscopist: Gatha Mayer , MD Age: 60 Referring MD:  Date of Birth: 12/06/1960 Gender: Female Account #: [id number] Procedure:                Colonoscopy Indications:              Screening for colorectal malignant neoplasm, Last                            colonoscopy: 2012 Medicines:                Propofol per Anesthesia, Monitored Anesthesia Care Procedure:                Pre-Anesthesia Assessment:                           - Prior to the procedure, a History and Physical                            was performed, and patient medications and                            allergies were reviewed. The patient's tolerance of                            previous anesthesia was also reviewed. The risks                            and benefits of the procedure and the sedation                            options and risks were discussed with the patient.                            All questions were answered, and informed consent                            was obtained. Prior Anticoagulants: The patient has                            taken no previous anticoagulant or antiplatelet                            agents. ASA Grade Assessment: II - A patient with                            mild systemic disease. After reviewing the risks                            and benefits, the patient was deemed in                            satisfactory condition to undergo the procedure.  After obtaining informed consent, the colonoscope                            was passed under direct vision. Throughout the                            procedure, the patient's blood pressure, pulse, and                            oxygen saturations were monitored continuously. The                            Olympus PCF-H190DL (#[id number]) Colonoscope was                            introduced  through the anus and advanced to the the                            cecum, identified by appendiceal orifice and                            ileocecal valve. The colonoscopy was performed                            without difficulty. The patient tolerated the                            procedure well. The quality of the bowel                            preparation was excellent. The ileocecal valve,                            appendiceal orifice, and rectum were photographed.                            The bowel preparation used was Miralax via split                            dose instruction. Scope In: 10:37:43 AM Scope Out: 10:58:11 AM Scope Withdrawal Time: 0 hours 15 minutes 59 seconds  Total Procedure Duration: 0 hours 20 minutes 28 seconds  Findings:                 The perianal and digital rectal examinations were                            normal.                           Two sessile polyps were found in the proximal                            rectum, ascending colon and distal ascending colon.  The polyps were 1 to 2 mm in size. These polyps                            were removed with a cold biopsy forceps. Resection                            and retrieval were complete. Verification of                            patient identification for the specimen was done.                            Estimated blood loss was minimal.                           The exam was otherwise without abnormality on                            direct and retroflexion views. Complications:            No immediate complications. Estimated Blood Loss:     Estimated blood loss was minimal. Impression:               - Two 1 to 2 mm polyps in the proximal rectum, in                            the ascending colon and in the distal ascending                            colon, removed with a cold biopsy forceps. Resected                            and retrieved.                            - The examination was otherwise normal on direct                            and retroflexion views. Recommendation:           - Patient has a contact number available for                            emergencies. The signs and symptoms of potential                            delayed complications were discussed with the                            patient. Return to normal activities tomorrow.                            Written discharge instructions were provided to the  patient.                           - Resume previous diet.                           - Continue present medications.                           - Repeat colonoscopy is recommended. The                            colonoscopy date will be determined after pathology                            results from today's exam become available for                            review. Gatha Mayer, MD 12/04/2020 11:08:07 AM This report has been signed electronically.

## 2020-12-04 NOTE — Progress Notes (Signed)
DT - VS   Pt's states no medical or surgical changes since previsit or office visit.

## 2020-12-04 NOTE — Progress Notes (Signed)
Called to room to assist during endoscopic procedure.  Patient ID and intended procedure confirmed with present staff. Received instructions for my participation in the procedure from the performing physician.  

## 2020-12-04 NOTE — Patient Instructions (Signed)
Thank you for letting us take care of your healthcare needs today. Please see handouts given to you on Polyps.    YOU HAD AN ENDOSCOPIC PROCEDURE TODAY AT [facility]:   Refer to the procedure report that was given to you for any specific questions about what was found during the examination.  If the procedure report does not answer your questions, please call your gastroenterologist to clarify.  If you requested that your care partner not be given the details of your procedure findings, then the procedure report has been included in a sealed envelope for you to review at your convenience later.  YOU SHOULD EXPECT: Some feelings of bloating in the abdomen. Passage of more gas than usual.  Walking can help get rid of the air that was put into your GI tract during the procedure and reduce the bloating. If you had a lower endoscopy (such as a colonoscopy or flexible sigmoidoscopy) you may notice spotting of blood in your stool or on the toilet paper. If you underwent a bowel prep for your procedure, you may not have a normal bowel movement for a few days.  Please Note:  You might notice some irritation and congestion in your nose or some drainage.  This is from the oxygen used during your procedure.  There is no need for concern and it should clear up in a day or so.  SYMPTOMS TO REPORT IMMEDIATELY:  Following lower endoscopy (colonoscopy or flexible sigmoidoscopy):  Excessive amounts of blood in the stool  Significant tenderness or worsening of abdominal pains  Swelling of the abdomen that is new, acute  Fever of 100F or higher   For urgent or emergent issues, a gastroenterologist can be reached at any hour by calling [phone]. Do not use MyChart messaging for urgent concerns.    DIET:  We do recommend a small meal at first, but then you may proceed to your regular diet.  Drink plenty of fluids but you should avoid alcoholic beverages for 24 hours.  ACTIVITY:  You  should plan to take it easy for the rest of today and you should NOT DRIVE or use heavy machinery until tomorrow (because of the sedation medicines used during the test).    FOLLOW UP: Our staff will call the number listed on your records 48-72 hours following your procedure to check on you and address any questions or concerns that you may have regarding the information given to you following your procedure. If we do not reach you, we will leave a message.  We will attempt to reach you two times.  During this call, we will ask if you have developed any symptoms of COVID 19. If you develop any symptoms (ie: fever, flu-like symptoms, shortness of breath, cough etc.) before then, please call [phone].  If you test positive for Covid 19 in the 2 weeks post procedure, please call and report this information to [place].    If any biopsies were taken you will be contacted by phone or by letter within the next 1-3 weeks.  Please call us at [phone] if you have not heard about the biopsies in 3 weeks.    SIGNATURES/CONFIDENTIALITY: You and/or your care partner have signed paperwork which will be entered into your electronic medical record.  These signatures attest to the fact that that the information above on your After Visit Summary has been reviewed and is understood.  Full responsibility of the confidentiality of this discharge information lies with  you and/or your care-partner.

## 2020-12-04 NOTE — Progress Notes (Signed)
[facility] History and Physical   Primary Care Physician:  Shelva Majestic, MD   Reason for Procedure:   Colon cancer screening  Plan:    colonoscopy     HPI: Christine Medina is a 60 y.o. female here for screening colonoscopy   Past Medical History:  Diagnosis Date   Hyperlipidemia     Past Surgical History:  Procedure Laterality Date   COLONOSCOPY     ~ 10 years ago   CYST EXCISION     benign- right upper arm   WISDOM TOOTH EXTRACTION      Prior to Admission medications   Medication Sig Start Date End Date Taking? Authorizing Provider  cholecalciferol (VITAMIN D3) 25 MCG (1000 UNIT) tablet Take 1,000 Units by mouth daily.    [provider]  Estradiol 10 MCG TABS vaginal tablet Place one tablet vaginally qhs x 1 week, then 2 x a week at hs Patient not taking: No sig reported 08/21/20   Romualdo Bolk, MD  Multiple Vitamins-Minerals (EMERGEN-C IMMUNE PO) Take by mouth.    [provider]  Omega-3 Fatty Acids (FISH OIL) 1200 MG CAPS Take by mouth.    [provider]    Current Outpatient Medications  Medication Sig Dispense Refill   cholecalciferol (VITAMIN D3) 25 MCG (1000 UNIT) tablet Take 1,000 Units by mouth daily.     Estradiol 10 MCG TABS vaginal tablet Place one tablet vaginally qhs x 1 week, then 2 x a week at hs (Patient not taking: No sig reported) 24 tablet 3   Multiple Vitamins-Minerals (EMERGEN-C IMMUNE PO) Take by mouth.     Omega-3 Fatty Acids (FISH OIL) 1200 MG CAPS Take by mouth.     Current Facility-Administered Medications  Medication Dose Route Frequency Provider Last Rate Last Admin   0.9 %  sodium chloride infusion  500 mL Intravenous Once Iva Boop, MD        Allergies as of 12/04/2020   (No Known Allergies)    Family History  Problem Relation Age of Onset   Brain cancer Mother        glioblastoma   Hyperlipidemia Mother    Heart disease Father        63. cigar smoker.     Lung cancer Sister    Multiple myeloma Sister    Healthy Sister    Heart disease Brother        48s. heavy smoker.    Breast cancer Maternal Aunt    Depression Daughter    Colon cancer Neg Hx    Colon polyps Neg Hx    Esophageal cancer Neg Hx    Stomach cancer Neg Hx    Rectal cancer Neg Hx     Social History   Socioeconomic History   Marital status: Married                   Occupational History   Occupation: Ph D  Tobacco Use   Smoking status: Former    Packs/day: 0.50    Years: 5.00    Pack years: 2.50    Types: Cigarettes    Quit date: 02/17/1989    Years since quitting: 31.8   Smokeless tobacco: Never  Vaping Use   Vaping Use: Never used  Substance and Sexual Activity   Alcohol use: Yes    Alcohol/week: 7.0 standard drinks    Types: 7 Glasses of wine per week    Comment: -- socially wine  Drug use: No   Sexual activity: Yes    Partners: Male    Birth control/protection: Post-menopausal        Social History Narrative   Married.  Son 57 and daughter 54 both in [place]. Son in business school. Daughter getting masters in [place]   Just moved here from [place] with husband.       Works as Optometrist- with nonprofits in art fields      Review of Systems:  other review of systems negative except as mentioned in the HPI.  Physical Exam: Vital signs BP 108/67   Pulse (!) 58   Temp 97.7 F (36.5 C)   Resp 12   Ht 5' 9.5" (1.765 m)   Wt 155 lb (70.3 kg)   LMP  (LMP Unknown)   SpO2 97%   BMI 22.56 kg/m   General:   Alert,  Well-developed, well-nourished, pleasant and cooperative in NAD Lungs:  Clear throughout to auscultation.   Heart:  Regular rate and rhythm; no murmurs, clicks, rubs,  or gallops. Abdomen:  Soft, nontender and nondistended. Normal bowel sounds.   Neuro/Psych:  Alert and cooperative. Normal mood and affect. A and O x 3   '@Javante Nilsson'$  Simonne Maffucci, MD, [facility] [phone] (pager) 12/04/2020 10:27  AM@

## 2020-12-04 NOTE — Progress Notes (Signed)
Pt Drowsy. VSS. To PACU, report to RN. No anesthetic complications noted.  

## 2020-12-06 ENCOUNTER — Telehealth: Payer: Self-pay | Admitting: *Deleted

## 2020-12-06 ENCOUNTER — Telehealth: Payer: Self-pay

## 2020-12-06 NOTE — Telephone Encounter (Signed)
Attempted f/u call. No answer, left VM. 

## 2020-12-06 NOTE — Telephone Encounter (Signed)
  Follow up Call-  Call back number 12/04/2020  Post procedure Call Back phone  # [phone] cell  Permission to leave phone message Yes  Some recent data might be hidden     Patient questions:  Do you have a fever, pain , or abdominal swelling? No. Pain Score  0 *  Have you tolerated food without any problems? Yes.    Have you been able to return to your normal activities? Yes.    Do you have any questions about your discharge instructions: Diet   No. Medications  No. Follow up visit  No.  Do you have questions or concerns about your Care? No.  Actions: * If pain score is 4 or above: No action needed, pain <4.  Have you developed a fever since your procedure? no  2.   Have you had an respiratory symptoms (SOB or cough) since your procedure? no  3.   Have you tested positive for COVID 19 since your procedure no  4.   Have you had any family members/close contacts diagnosed with the COVID 19 since your procedure?  no   If yes to any of these questions please route to Joylene John, RN and Joella Prince, RN

## 2020-12-13 ENCOUNTER — Encounter: Payer: Self-pay | Admitting: Internal Medicine

## 2020-12-21 ENCOUNTER — Encounter: Payer: Self-pay | Admitting: Family Medicine

## 2021-01-16 NOTE — Progress Notes (Signed)
Phone [phone]   Subjective:  Patient presents today for their annual physical. Chief complaint-noted.   See problem oriented charting- Review of Systems  Constitutional:  Negative for chills and fever.  HENT:  Negative for congestion, ear discharge and hearing loss.   Eyes:  Negative for blurred vision and double vision.  Respiratory:  Negative for cough and shortness of breath.   Cardiovascular:  Negative for chest pain and palpitations.  Gastrointestinal:  Negative for abdominal pain, blood in stool, constipation, diarrhea, melena, nausea and vomiting.  Genitourinary:  Negative for dysuria, frequency and urgency.  Musculoskeletal:  Negative for back pain and myalgias.  Skin:  Negative for itching and rash.  Neurological:  Negative for dizziness and headaches.  Endo/Heme/Allergies:  Negative for polydipsia. Does not bruise/bleed easily.  Psychiatric/Behavioral:  Negative for depression and suicidal ideas.    The following were reviewed and entered/updated in epic: Past Medical History:  Diagnosis Date   Hyperlipidemia    Patient Active Problem List   Diagnosis Date Noted   Hyperlipidemia, unspecified 06/24/2017   Lateral epicondylitis of right elbow 03/09/2017   Past Surgical History:  Procedure Laterality Date   COLONOSCOPY     ~ 10 years ago   CYST EXCISION     benign- right upper arm   WISDOM TOOTH EXTRACTION      Family History  Problem Relation Age of Onset   Brain cancer Mother        glioblastoma   Hyperlipidemia Mother    Heart disease Father        59. cigar smoker.    Lung cancer Sister    Multiple myeloma Sister    Healthy Sister    Heart disease Brother        64s. heavy smoker.    Breast cancer Maternal Aunt    Depression Daughter    Colon cancer Neg Hx    Colon polyps Neg Hx    Esophageal cancer Neg Hx    Stomach cancer Neg Hx    Rectal cancer Neg Hx     Medications- reviewed and updated Current Outpatient Medications  Medication  Sig Dispense Refill   cholecalciferol (VITAMIN D3) 25 MCG (1000 UNIT) tablet Take 1,000 Units by mouth daily.     Omega-3 Fatty Acids (FISH OIL) 1200 MG CAPS Take by mouth.     Estradiol 10 MCG TABS vaginal tablet Place one tablet vaginally qhs x 1 week, then 2 x a week at hs (Patient not taking: Reported on 01/21/2021) 24 tablet 3   Multiple Vitamins-Minerals (EMERGEN-C IMMUNE PO) Take by mouth. (Patient not taking: Reported on 01/21/2021)     No current facility-administered medications for this visit.    Allergies-reviewed and updated No Known Allergies  Social History   Social History Narrative   Widowed- lost husband 04/11/20.   Son and daughter both in [place] in 23s. Son in business school. Daughter getting masters in [place]   Had moved here from [place] with husband.       Sold house in [place]- will be moving to [place] to school there, married there, professional affiliations and lifelong friends! Will be a good spot for a new chapter- much easier for kids to get home from [place]      Works as Optometrist- with nonprofits in art fields   Objective  Objective:  BP 122/78   Pulse 62   Temp 98.2 F (36.8 C) (Temporal)   Ht 5' 9.5" (1.765 m)  Wt 156 lb (70.8 kg)   LMP  (LMP Unknown)   SpO2 97%   BMI 22.71 kg/m  Gen: NAD, resting comfortably HEENT: Mucous membranes are moist. Oropharynx normal Neck: no thyromegaly CV: RRR no murmurs rubs or gallops Lungs: CTAB no crackles, wheeze, rhonchi Abdomen: soft/nontender/nondistended/normal bowel sounds. No rebound or guarding.  Ext: no edema Skin: warm, dry Neuro: grossly normal, moves all extremities, PERRLA   Assessment and Plan   60 y.o. female presenting for annual physical.  Health Maintenance counseling: 1. Anticipatory guidance: Patient counseled regarding regular dental exams -q6 months, eye exams -yearly ,  avoiding smoking and second hand smoke, limiting alcohol to 1 beverage per day- up to 2 advised to  limit.  No illicit drugs  2. Risk factor reduction:  Advised patient of need for regular exercise and diet rich and fruits and vegetables to reduce risk of heart attack and stroke.  Exercise- pilates (strenuous weekly)/yoga/walking (daily).  Diet-reaosnably healthy diet. down 10-11 lbs in last year Wt Readings from Last 3 Encounters:  01/21/21 156 lb (70.8 kg)  12/04/20 155 lb (70.3 kg)  11/20/20 155 lb (70.3 kg)  3. Immunizations/screenings/ancillary studies- fully up to date Immunization History  Administered Date(s) Administered   Influenza, High Dose Seasonal PF 12/24/2020   Influenza,inj,Quad PF,6+ Mos 10/22/2016, 10/30/2018, 12/24/2020   MMR 07/06/2017, 08/03/2017   PFIZER(Purple Top)SARS-COV-2 Vaccination 04/21/2019, 05/19/2019, 12/19/2019, 01/02/2020   Pfizer Covid-19 Vaccine Bivalent Booster 48yr & up 01/01/2021   Tdap 06/13/2008, 07/20/2018   Zoster Recombinat (Shingrix) 09/18/2020, 12/24/2020   Zoster, Live 12/24/2020   4. Cervical cancer screening-pap smear 04/22/17 with 5 year repeat planned and hpv negative.   5. Breast cancer screening-  breast exam - breast exam self exams every months and gyn yearly  and mammogram  10/06/20 with 1 year repeat planned 6. Colon cancer screening -12/04/20 with 10 year repeat planned   7. Skin cancer screening-  sees dermatology yearly. advised regular sunscreen use. Denies worrisome, changing, or new skin lesions.  8. Birth control/STD check-postmenopausal. Not dating yet.  9. Osteoporosis screening at 89 will plan on at 89 no family history- possibly sooner when moves -Former smoker- quit in 1990s and under 3 pack years   Status of chronic or acute concerns    #social update- [place]! And her son and daughter- have been amazing patients to work with  #elevated tsh-TSH yearly due to mildly elevated levels- will keep an eye on this today- as long as levels under 10 likely not to treat  #very  mild ALT elevation- alcohol up slightly after loss of husband- will monitor with labs.  Lab Results  Component Value Date   ALT 32 (H) 01/30/2020   AST 29 01/30/2020   ALKPHOS 97 01/10/2019   BILITOT 0.5 01/30/2020     #hyperlipidemia S: Medication:none  . 10 year ascvd risk under 5%  Lab Results  Component Value Date   CHOL 245 (H) 01/30/2020   HDL 72 01/30/2020   LDLCALC 155 (H) 01/30/2020   TRIG 75 01/30/2020   CHOLHDL 3.4 01/30/2020   A/P: will monitor with labs today   #stress management- had a life coach in the past. Discussed therapist last year then lost husband LVista Deck has found a great grief counselor going every 4 weeks or even 8 weeks now. She may not be able to maintain when moves  [place]  [place]  Lab/Order associations:NOT fasting   ICD-10-CM   1. Preventative health care  Z00.00 CBC with Differential/Platelet    Comprehensive metabolic panel    Lipid panel    TSH    2. Hyperlipidemia, unspecified hyperlipidemia type  E78.5 CBC with Differential/Platelet    Comprehensive metabolic panel    Lipid panel    3. Elevated TSH  R79.89 TSH    4. Urinary frequency  R35.0     5. History of COVID-19  Z86.16       No orders of the defined types were placed in this encounter.   I,Jada Bradford,acting as a scribe for Garret Reddish, MD.,have documented all relevant documentation on the behalf of Garret Reddish, MD,as directed by  Garret Reddish, MD while in the presence of Garret Reddish, MD.  I, Garret Reddish, MD, have reviewed all documentation for this visit. The documentation on 01/21/21 for the exam, diagnosis, procedures, and orders are all accurate and complete.  Return precautions advised.  Garret Reddish, MD

## 2021-01-21 ENCOUNTER — Encounter: Payer: Self-pay | Admitting: Family Medicine

## 2021-01-21 ENCOUNTER — Other Ambulatory Visit: Payer: Self-pay

## 2021-01-21 ENCOUNTER — Ambulatory Visit (INDEPENDENT_AMBULATORY_CARE_PROVIDER_SITE_OTHER): Payer: BC Managed Care – PPO | Admitting: Family Medicine

## 2021-01-21 VITALS — BP 122/78 | HR 62 | Temp 98.2°F | Ht 69.5 in | Wt 156.0 lb

## 2021-01-21 DIAGNOSIS — Z8616 Personal history of COVID-19: Secondary | ICD-10-CM

## 2021-01-21 DIAGNOSIS — R350 Frequency of micturition: Secondary | ICD-10-CM

## 2021-01-21 DIAGNOSIS — R7989 Other specified abnormal findings of blood chemistry: Secondary | ICD-10-CM

## 2021-01-21 DIAGNOSIS — E785 Hyperlipidemia, unspecified: Secondary | ICD-10-CM | POA: Diagnosis not present

## 2021-01-21 DIAGNOSIS — Z0000 Encounter for general adult medical examination without abnormal findings: Secondary | ICD-10-CM | POA: Diagnosis not present

## 2021-01-21 NOTE — Patient Instructions (Addendum)
THanks for doing labs If you have mychart- we will send your results within 3 business days of [place] receiving them.  If you do not have mychart- we will call you about results within 5 business days of [place] receiving them.  *please also note that you will see labs on mychart as soon as they post. I will later go in and write notes on them- will say "notes from Dr. Yong Channel"   Recommended follow up: get established in [place] when you move- we will certainly miss you

## 2021-01-22 LAB — CBC WITH DIFFERENTIAL/PLATELET
Basophils Absolute: 0 10*3/uL (ref 0.0–0.1)
Basophils Relative: 0.7 % (ref 0.0–3.0)
Eosinophils Absolute: 0.2 10*3/uL (ref 0.0–0.7)
Eosinophils Relative: 2.3 % (ref 0.0–5.0)
HCT: 38.7 % (ref 36.0–46.0)
Hemoglobin: 12.6 g/dL (ref 12.0–15.0)
Lymphocytes Relative: 20.9 % (ref 12.0–46.0)
Lymphs Abs: 1.4 10*3/uL (ref 0.7–4.0)
MCHC: 32.7 g/dL (ref 30.0–36.0)
MCV: 87.7 fl (ref 78.0–100.0)
Monocytes Absolute: 0.5 10*3/uL (ref 0.1–1.0)
Monocytes Relative: 6.7 % (ref 3.0–12.0)
Neutro Abs: 4.7 10*3/uL (ref 1.4–7.7)
Neutrophils Relative %: 69.4 % (ref 43.0–77.0)
Platelets: 259 10*3/uL (ref 150.0–400.0)
RBC: 4.41 Mil/uL (ref 3.87–5.11)
RDW: 13.5 % (ref 11.5–15.5)
WBC: 6.8 10*3/uL (ref 4.0–10.5)

## 2021-01-22 LAB — COMPREHENSIVE METABOLIC PANEL
ALT: 24 U/L (ref 0–35)
AST: 25 U/L (ref 0–37)
Albumin: 4.4 g/dL (ref 3.5–5.2)
Alkaline Phosphatase: 81 U/L (ref 39–117)
BUN: 20 mg/dL (ref 6–23)
CO2: 27 mEq/L (ref 19–32)
Calcium: 9.6 mg/dL (ref 8.4–10.5)
Chloride: 105 mEq/L (ref 96–112)
Creatinine, Ser: 0.77 mg/dL (ref 0.40–1.20)
GFR: 83.34 mL/min (ref >60.00)
Glucose, Bld: 76 mg/dL (ref 70–99)
Potassium: 4.7 mEq/L (ref 3.5–5.1)
Sodium: 141 mEq/L (ref 135–145)
Total Bilirubin: 0.3 mg/dL (ref 0.2–1.2)
Total Protein: 7.1 g/dL (ref 6.0–8.3)

## 2021-01-22 LAB — TSH: TSH: 4.25 u[IU]/mL (ref 0.35–5.50)

## 2021-01-22 LAB — LIPID PANEL
Cholesterol: 222 mg/dL — ABNORMAL HIGH (ref 0–200)
HDL: 73.9 mg/dL (ref >39.00)
LDL Cholesterol: 128 mg/dL — ABNORMAL HIGH (ref 0–99)
NonHDL: 148.23
Total CHOL/HDL Ratio: 3
Triglycerides: 101 mg/dL (ref 0.0–149.0)
VLDL: 20.2 mg/dL (ref 0.0–40.0)

## 2021-08-01 ENCOUNTER — Telehealth: Payer: Self-pay | Admitting: *Deleted

## 2021-08-01 NOTE — Telephone Encounter (Signed)
Patient called and left detailed message in triage voicemail she has moved to [place]. The imaging center in [place] need previous films. I called patient back and left detailed message on voicemail she will need to call the breast center so films can be transferred. Number left on voicemail for breast center as will per DPR access.

## 2021-11-11 ENCOUNTER — Encounter: Payer: Self-pay | Admitting: *Deleted

## 2021-12-04 ENCOUNTER — Telehealth: Payer: Self-pay | Admitting: Family Medicine

## 2021-12-04 NOTE — Telephone Encounter (Signed)
Called and spoke with pt and questions answered. 

## 2021-12-04 NOTE — Telephone Encounter (Signed)
Pt states: -Scheduled for Moderna COVID-19 shot today 12/04/21 -Previously only had the [place]. -appt was difficult to make, kept being cancelled.   Pt asks: -Is it ok for her to have [place] after [employer].    Pt requests: -call or mychart message to confirm it's ok to go forward with moderna shot
